# Patient Record
Sex: Female | Born: 1950
Health system: Southern US, Community
[De-identification: ages and names within clinical notes are randomized; demographics above are authoritative.]

## PROBLEM LIST (undated history)

## (undated) DIAGNOSIS — N840 Polyp of corpus uteri: Secondary | ICD-10-CM

## (undated) DIAGNOSIS — N938 Other specified abnormal uterine and vaginal bleeding: Secondary | ICD-10-CM

## (undated) DIAGNOSIS — M1990 Unspecified osteoarthritis, unspecified site: Secondary | ICD-10-CM

## (undated) HISTORY — PX: HYSTEROSCOPY: SHX211

## (undated) HISTORY — PX: OTHER SURGICAL HISTORY: SHX169

## (undated) HISTORY — DX: Unspecified osteoarthritis, unspecified site: M19.90

## (undated) HISTORY — PX: TUBAL LIGATION: SHX77

## (undated) HISTORY — DX: Polyp of corpus uteri: N84.0

## (undated) HISTORY — DX: Other specified abnormal uterine and vaginal bleeding: N93.8

---

## 1998-06-25 ENCOUNTER — Encounter: Payer: Self-pay | Admitting: Internal Medicine

## 1998-06-25 ENCOUNTER — Ambulatory Visit (HOSPITAL_COMMUNITY): Admission: RE | Admit: 1998-06-25 | Discharge: 1998-06-25 | Payer: Self-pay | Admitting: Internal Medicine

## 1999-01-20 ENCOUNTER — Other Ambulatory Visit: Admission: RE | Admit: 1999-01-20 | Discharge: 1999-01-20 | Payer: Self-pay | Admitting: Obstetrics and Gynecology

## 1999-01-20 ENCOUNTER — Encounter (INDEPENDENT_AMBULATORY_CARE_PROVIDER_SITE_OTHER): Payer: Self-pay

## 1999-03-25 ENCOUNTER — Ambulatory Visit (HOSPITAL_COMMUNITY): Admission: RE | Admit: 1999-03-25 | Discharge: 1999-03-25 | Payer: Self-pay | Admitting: Obstetrics and Gynecology

## 1999-03-25 ENCOUNTER — Encounter (INDEPENDENT_AMBULATORY_CARE_PROVIDER_SITE_OTHER): Payer: Self-pay | Admitting: Specialist

## 1999-06-30 ENCOUNTER — Encounter: Payer: Self-pay | Admitting: Obstetrics and Gynecology

## 1999-06-30 ENCOUNTER — Ambulatory Visit (HOSPITAL_COMMUNITY): Admission: RE | Admit: 1999-06-30 | Discharge: 1999-06-30 | Payer: Self-pay | Admitting: Obstetrics and Gynecology

## 1999-07-09 ENCOUNTER — Encounter (INDEPENDENT_AMBULATORY_CARE_PROVIDER_SITE_OTHER): Payer: Self-pay

## 1999-07-09 ENCOUNTER — Other Ambulatory Visit: Admission: RE | Admit: 1999-07-09 | Discharge: 1999-07-09 | Payer: Self-pay | Admitting: Obstetrics and Gynecology

## 2000-07-01 ENCOUNTER — Encounter: Payer: Self-pay | Admitting: Obstetrics and Gynecology

## 2000-07-01 ENCOUNTER — Ambulatory Visit (HOSPITAL_COMMUNITY): Admission: RE | Admit: 2000-07-01 | Discharge: 2000-07-01 | Payer: Self-pay | Admitting: Obstetrics and Gynecology

## 2001-07-17 ENCOUNTER — Ambulatory Visit (HOSPITAL_COMMUNITY): Admission: RE | Admit: 2001-07-17 | Discharge: 2001-07-17 | Payer: Self-pay | Admitting: Obstetrics and Gynecology

## 2001-07-17 ENCOUNTER — Encounter: Payer: Self-pay | Admitting: Obstetrics and Gynecology

## 2001-08-02 HISTORY — PX: BREAST BIOPSY: SHX20

## 2001-08-09 ENCOUNTER — Encounter: Admission: RE | Admit: 2001-08-09 | Discharge: 2001-08-09 | Payer: Self-pay | Admitting: Obstetrics and Gynecology

## 2001-08-09 ENCOUNTER — Encounter: Payer: Self-pay | Admitting: Obstetrics and Gynecology

## 2001-08-21 ENCOUNTER — Encounter: Admission: RE | Admit: 2001-08-21 | Discharge: 2001-08-21 | Payer: Self-pay | Admitting: Internal Medicine

## 2001-08-21 ENCOUNTER — Encounter (INDEPENDENT_AMBULATORY_CARE_PROVIDER_SITE_OTHER): Payer: Self-pay | Admitting: Specialist

## 2001-08-21 ENCOUNTER — Encounter: Payer: Self-pay | Admitting: Internal Medicine

## 2001-11-16 ENCOUNTER — Other Ambulatory Visit: Admission: RE | Admit: 2001-11-16 | Discharge: 2001-11-16 | Payer: Self-pay | Admitting: Obstetrics and Gynecology

## 2002-09-07 ENCOUNTER — Encounter: Admission: RE | Admit: 2002-09-07 | Discharge: 2002-09-07 | Payer: Self-pay | Admitting: Internal Medicine

## 2002-09-07 ENCOUNTER — Encounter: Payer: Self-pay | Admitting: Internal Medicine

## 2002-11-20 ENCOUNTER — Other Ambulatory Visit: Admission: RE | Admit: 2002-11-20 | Discharge: 2002-11-20 | Payer: Self-pay | Admitting: Obstetrics and Gynecology

## 2003-09-11 ENCOUNTER — Encounter: Admission: RE | Admit: 2003-09-11 | Discharge: 2003-09-11 | Payer: Self-pay | Admitting: Obstetrics and Gynecology

## 2003-12-04 ENCOUNTER — Other Ambulatory Visit: Admission: RE | Admit: 2003-12-04 | Discharge: 2003-12-04 | Payer: Self-pay | Admitting: Obstetrics and Gynecology

## 2004-09-21 ENCOUNTER — Encounter: Admission: RE | Admit: 2004-09-21 | Discharge: 2004-09-21 | Payer: Self-pay | Admitting: Obstetrics and Gynecology

## 2004-12-07 ENCOUNTER — Other Ambulatory Visit: Admission: RE | Admit: 2004-12-07 | Discharge: 2004-12-07 | Payer: Self-pay | Admitting: Obstetrics and Gynecology

## 2005-09-30 ENCOUNTER — Encounter: Admission: RE | Admit: 2005-09-30 | Discharge: 2005-09-30 | Payer: Self-pay | Admitting: Obstetrics and Gynecology

## 2005-12-10 ENCOUNTER — Other Ambulatory Visit: Admission: RE | Admit: 2005-12-10 | Discharge: 2005-12-10 | Payer: Self-pay | Admitting: Obstetrics and Gynecology

## 2006-10-03 ENCOUNTER — Encounter: Admission: RE | Admit: 2006-10-03 | Discharge: 2006-10-03 | Payer: Self-pay | Admitting: Obstetrics and Gynecology

## 2006-11-01 ENCOUNTER — Encounter: Admission: RE | Admit: 2006-11-01 | Discharge: 2006-11-01 | Payer: Self-pay | Admitting: Obstetrics and Gynecology

## 2006-12-12 ENCOUNTER — Other Ambulatory Visit: Admission: RE | Admit: 2006-12-12 | Discharge: 2006-12-12 | Payer: Self-pay | Admitting: Obstetrics and Gynecology

## 2007-11-10 ENCOUNTER — Encounter: Admission: RE | Admit: 2007-11-10 | Discharge: 2007-11-10 | Payer: Self-pay | Admitting: Obstetrics and Gynecology

## 2007-12-14 ENCOUNTER — Other Ambulatory Visit: Admission: RE | Admit: 2007-12-14 | Discharge: 2007-12-14 | Payer: Self-pay | Admitting: Obstetrics and Gynecology

## 2008-08-19 ENCOUNTER — Ambulatory Visit: Payer: Self-pay | Admitting: Obstetrics and Gynecology

## 2008-11-11 ENCOUNTER — Encounter: Admission: RE | Admit: 2008-11-11 | Discharge: 2008-11-11 | Payer: Self-pay | Admitting: Obstetrics and Gynecology

## 2008-11-19 ENCOUNTER — Encounter: Admission: RE | Admit: 2008-11-19 | Discharge: 2008-11-19 | Payer: Self-pay | Admitting: Obstetrics and Gynecology

## 2008-12-16 ENCOUNTER — Encounter: Payer: Self-pay | Admitting: Obstetrics and Gynecology

## 2008-12-16 ENCOUNTER — Ambulatory Visit: Payer: Self-pay | Admitting: Obstetrics and Gynecology

## 2008-12-16 ENCOUNTER — Other Ambulatory Visit: Admission: RE | Admit: 2008-12-16 | Discharge: 2008-12-16 | Payer: Self-pay | Admitting: Obstetrics and Gynecology

## 2009-11-14 ENCOUNTER — Encounter: Admission: RE | Admit: 2009-11-14 | Discharge: 2009-11-14 | Payer: Self-pay | Admitting: Obstetrics and Gynecology

## 2009-12-24 ENCOUNTER — Other Ambulatory Visit: Admission: RE | Admit: 2009-12-24 | Discharge: 2009-12-24 | Payer: Self-pay | Admitting: Obstetrics and Gynecology

## 2009-12-24 ENCOUNTER — Ambulatory Visit: Payer: Self-pay | Admitting: Obstetrics and Gynecology

## 2010-10-14 ENCOUNTER — Encounter (INDEPENDENT_AMBULATORY_CARE_PROVIDER_SITE_OTHER): Payer: 59

## 2010-10-14 DIAGNOSIS — Z13820 Encounter for screening for osteoporosis: Secondary | ICD-10-CM

## 2010-10-19 ENCOUNTER — Other Ambulatory Visit: Payer: Self-pay | Admitting: Obstetrics and Gynecology

## 2010-10-19 DIAGNOSIS — Z1231 Encounter for screening mammogram for malignant neoplasm of breast: Secondary | ICD-10-CM

## 2010-10-30 ENCOUNTER — Ambulatory Visit
Admission: RE | Admit: 2010-10-30 | Discharge: 2010-10-30 | Disposition: A | Discharge status: Home / Self Care | Payer: 59 | Source: Ambulatory Visit | Attending: Family Medicine | Admitting: Family Medicine

## 2010-10-30 ENCOUNTER — Other Ambulatory Visit: Payer: Self-pay | Admitting: Family Medicine

## 2010-10-30 DIAGNOSIS — M545 Low back pain, unspecified: Secondary | ICD-10-CM

## 2010-11-16 ENCOUNTER — Ambulatory Visit: Payer: 59

## 2010-11-16 ENCOUNTER — Ambulatory Visit
Admission: RE | Admit: 2010-11-16 | Discharge: 2010-11-16 | Disposition: A | Discharge status: Home / Self Care | Payer: 59 | Source: Ambulatory Visit | Attending: Obstetrics and Gynecology | Admitting: Obstetrics and Gynecology

## 2010-11-16 DIAGNOSIS — Z1231 Encounter for screening mammogram for malignant neoplasm of breast: Secondary | ICD-10-CM

## 2010-12-30 ENCOUNTER — Encounter (INDEPENDENT_AMBULATORY_CARE_PROVIDER_SITE_OTHER): Payer: 59 | Admitting: Obstetrics and Gynecology

## 2010-12-30 ENCOUNTER — Other Ambulatory Visit (HOSPITAL_COMMUNITY)
Admission: RE | Admit: 2010-12-30 | Discharge: 2010-12-30 | Disposition: A | Discharge status: Home / Self Care | Payer: 59 | Source: Ambulatory Visit | Attending: Obstetrics and Gynecology | Admitting: Obstetrics and Gynecology

## 2010-12-30 ENCOUNTER — Other Ambulatory Visit: Payer: Self-pay | Admitting: Obstetrics and Gynecology

## 2010-12-30 DIAGNOSIS — Z13220 Encounter for screening for lipoid disorders: Secondary | ICD-10-CM

## 2010-12-30 DIAGNOSIS — Z124 Encounter for screening for malignant neoplasm of cervix: Secondary | ICD-10-CM | POA: Insufficient documentation

## 2010-12-30 DIAGNOSIS — R823 Hemoglobinuria: Secondary | ICD-10-CM

## 2010-12-30 DIAGNOSIS — Z833 Family history of diabetes mellitus: Secondary | ICD-10-CM

## 2010-12-30 DIAGNOSIS — Z01419 Encounter for gynecological examination (general) (routine) without abnormal findings: Secondary | ICD-10-CM

## 2011-10-15 ENCOUNTER — Other Ambulatory Visit: Payer: Self-pay | Admitting: Obstetrics and Gynecology

## 2011-10-15 DIAGNOSIS — Z1231 Encounter for screening mammogram for malignant neoplasm of breast: Secondary | ICD-10-CM

## 2011-11-19 ENCOUNTER — Ambulatory Visit
Admission: RE | Admit: 2011-11-19 | Discharge: 2011-11-19 | Disposition: A | Discharge status: Home / Self Care | Payer: 59 | Source: Ambulatory Visit | Attending: Obstetrics and Gynecology | Admitting: Obstetrics and Gynecology

## 2011-11-19 DIAGNOSIS — Z1231 Encounter for screening mammogram for malignant neoplasm of breast: Secondary | ICD-10-CM

## 2011-12-23 ENCOUNTER — Encounter: Payer: Self-pay | Admitting: Gynecology

## 2011-12-23 DIAGNOSIS — N938 Other specified abnormal uterine and vaginal bleeding: Secondary | ICD-10-CM | POA: Insufficient documentation

## 2011-12-23 DIAGNOSIS — N840 Polyp of corpus uteri: Secondary | ICD-10-CM | POA: Insufficient documentation

## 2011-12-31 ENCOUNTER — Ambulatory Visit (INDEPENDENT_AMBULATORY_CARE_PROVIDER_SITE_OTHER): Payer: 59 | Admitting: Obstetrics and Gynecology

## 2011-12-31 ENCOUNTER — Encounter: Payer: Self-pay | Admitting: Obstetrics and Gynecology

## 2011-12-31 VITALS — BP 124/70 | Ht 63.0 in | Wt 166.0 lb

## 2011-12-31 DIAGNOSIS — M1990 Unspecified osteoarthritis, unspecified site: Secondary | ICD-10-CM | POA: Insufficient documentation

## 2011-12-31 DIAGNOSIS — Z01419 Encounter for gynecological examination (general) (routine) without abnormal findings: Secondary | ICD-10-CM

## 2011-12-31 NOTE — Progress Notes (Signed)
Patient came to see me today for her annual GYN exam. She continues to struggle with hot flashes but still is not ready to initiate HRT. She is having no vaginal bleeding. She is having no pelvic pain she has had a colonoscopy. She has had 4 normal bone densities. She does her lab work from her PCP. She's been diagnosed with osteoarthritis. She is having no bladder symptoms such as dysuria, frequency, urgency, or incontinence. She has never had a abnormal Pap smear.  Physical examination:Mary Hill present. HEENT within normal limits. Neck: Thyroid not large. No masses. Supraclavicular nodes: not enlarged. Breasts: Examined in both sitting and lying  position. No skin changes and no masses. Abdomen: Soft no guarding rebound or masses or hernia. Pelvic: External: Within normal limits. BUS: Within normal limits. Vaginal:within normal limits. Good estrogen effect. No evidence of cystocele rectocele or enterocele. Cervix: clean. Uterus: Normal size and shape. Adnexa: No masses. Rectovaginal exam: Confirmatory and negative. Extremities: Within normal limits.  Assessment: Menopausal symptoms  Plan: Continue yearly mammograms. She will call when necessary need for HRT.

## 2012-01-01 LAB — URINALYSIS W MICROSCOPIC + REFLEX CULTURE
Hgb urine dipstick: NEGATIVE
Nitrite: NEGATIVE
Protein, ur: NEGATIVE mg/dL
Urobilinogen, UA: 0.2 mg/dL (ref 0.0–1.0)

## 2012-01-02 LAB — URINE CULTURE: Colony Count: 75000

## 2012-10-17 ENCOUNTER — Other Ambulatory Visit: Payer: Self-pay

## 2012-10-17 DIAGNOSIS — Z1231 Encounter for screening mammogram for malignant neoplasm of breast: Secondary | ICD-10-CM

## 2012-11-24 ENCOUNTER — Ambulatory Visit
Admission: RE | Admit: 2012-11-24 | Discharge: 2012-11-24 | Disposition: A | Discharge status: Home / Self Care | Payer: 59 | Source: Ambulatory Visit

## 2012-11-24 DIAGNOSIS — Z1231 Encounter for screening mammogram for malignant neoplasm of breast: Secondary | ICD-10-CM

## 2013-01-05 ENCOUNTER — Ambulatory Visit (INDEPENDENT_AMBULATORY_CARE_PROVIDER_SITE_OTHER): Payer: 59 | Admitting: Women's Health

## 2013-01-05 ENCOUNTER — Encounter: Payer: Self-pay | Admitting: Women's Health

## 2013-01-05 ENCOUNTER — Other Ambulatory Visit (HOSPITAL_COMMUNITY)
Admission: RE | Admit: 2013-01-05 | Discharge: 2013-01-05 | Disposition: A | Discharge status: Home / Self Care | Payer: 59 | Source: Ambulatory Visit | Attending: Gynecology | Admitting: Gynecology

## 2013-01-05 VITALS — BP 112/70 | Ht 62.5 in | Wt 160.0 lb

## 2013-01-05 DIAGNOSIS — Z833 Family history of diabetes mellitus: Secondary | ICD-10-CM

## 2013-01-05 DIAGNOSIS — Z01419 Encounter for gynecological examination (general) (routine) without abnormal findings: Secondary | ICD-10-CM | POA: Insufficient documentation

## 2013-01-05 DIAGNOSIS — Z13220 Encounter for screening for lipoid disorders: Secondary | ICD-10-CM

## 2013-01-05 DIAGNOSIS — E079 Disorder of thyroid, unspecified: Secondary | ICD-10-CM

## 2013-01-05 LAB — CBC WITH DIFFERENTIAL/PLATELET
Basophils Absolute: 0 10*3/uL (ref 0.0–0.1)
Eosinophils Relative: 4 % (ref 0–5)
Lymphocytes Relative: 46 % (ref 12–46)
Lymphs Abs: 3.1 10*3/uL (ref 0.7–4.0)
MCV: 81.3 fL (ref 78.0–100.0)
Neutrophils Relative %: 41 % — ABNORMAL LOW (ref 43–77)
Platelets: 201 10*3/uL (ref 150–400)
RBC: 4.33 MIL/uL (ref 3.87–5.11)
RDW: 14.7 % (ref 11.5–15.5)
WBC: 6.7 10*3/uL (ref 4.0–10.5)

## 2013-01-05 LAB — GLUCOSE, RANDOM: Glucose, Bld: 101 mg/dL — ABNORMAL HIGH (ref 70–99)

## 2013-01-05 LAB — LIPID PANEL
LDL Cholesterol: 42 mg/dL (ref 0–99)
Triglycerides: 134 mg/dL (ref <150)

## 2013-01-05 NOTE — Addendum Note (Signed)
Addended by: Bertram Savin A on: 01/05/2013 04:18 PM   Modules accepted: Orders

## 2013-01-05 NOTE — Progress Notes (Signed)
Mary Hill 07/11/1951 [id number]    History:    The patient presents for annual exam.  Postmenopausal on no HRT. Mother died of breast cancer at age 62. Normal Pap and mammogram history. Negative colonoscopy 2009. Normal DEXA 10/2010 T score -0.6  left femoral neck.   Past medical history, past surgical history, family history and social history were all reviewed and documented in the EPIC chart. Works at [employer]. Has 2 grown sons both doing well. Benign endometrial polyp 2000. Father hypertension. Brother diabetes.   ROS:  A  ROS was performed and pertinent positives and negatives are included in the history.  Exam:  Filed Vitals:   01/05/13 1527  BP: 112/70    General appearance:  Normal Head/Neck:  Normal, without cervical or supraclavicular adenopathy. Thyroid:  Symmetrical, normal in size, without palpable masses or nodularity. Respiratory  Effort:  Normal  Auscultation:  Clear without wheezing or rhonchi Cardiovascular  Auscultation:  Regular rate, without rubs, murmurs or gallops  Edema/varicosities:  Not grossly evident Abdominal  Soft,nontender, without masses, guarding or rebound.  Liver/spleen:  No organomegaly noted  Hernia:  None appreciated  Skin  Inspection:  Grossly normal  Palpation:  Grossly normal Neurologic/psychiatric  Orientation:  Normal with appropriate conversation.  Mood/affect:  Normal  Genitourinary    Breasts: Examined lying and sitting.     Right: Without masses, retractions, discharge or axillary adenopathy.     Left: Without masses, retractions, discharge or axillary adenopathy.   Inguinal/mons:  Normal without inguinal adenopathy  External genitalia:  Normal  BUS/Urethra/Skene's glands:  Normal  Bladder:  Normal  Vagina:  Normal  Cervix:  Normal  Uterus:   normal in size, shape and contour.  Midline and mobile  Adnexa/parametria:     Rt: Without masses or tenderness.   Lt: Without masses or tenderness.  Anus and  perineum: Normal  Digital rectal exam: Normal sphincter tone without palpated masses or tenderness  Assessment/Plan:  62 y.o. MBF G2 P2 for annual exam with no complaints.  Normal postmenopausal exam on no HRT  Plan: Repeat DEXA next year, home safety and fall prevention discussed. Increase regular exercise, calcium rich diet, vitamin D 2000 daily encouraged. SBE's, continue annual mammogram. CBC, glucose, lipid panel, TSH, UA, Pap. Pap normal 2012, new screening guidelines reviewed. Home Hemoccult card given with instructions.   Harrington Challenger [facility], 3:59 PM 01/05/2013

## 2013-01-05 NOTE — Patient Instructions (Addendum)
zostavac or shingles vaccine Health Recommendations for Postmenopausal Women Respected and ongoing research has looked at the most common causes of death, disability, and poor quality of life in postmenopausal women. The causes include heart disease, diseases of blood vessels, diabetes, depression, cancer, and bone loss (osteoporosis). Many things can be done to help lower the chances of developing these and other common problems: CARDIOVASCULAR DISEASE Heart Disease: A heart attack is a medical emergency. Know the signs and symptoms of a heart attack. Below are things women can do to reduce their risk for heart disease.   Do not smoke. If you smoke, quit.  Aim for a healthy weight. Being overweight causes many preventable deaths. Eat a healthy and balanced diet and drink an adequate amount of liquids.  Get moving. Make a commitment to be more physically active. Aim for 30 minutes of activity on most, if not all days of the week.  Eat for heart health. Choose a diet that is low in saturated fat and cholesterol and eliminate trans fat. Include whole grains, vegetables, and fruits. Read and understand the labels on food containers before buying.  Know your numbers. Ask your caregiver to check your blood pressure, cholesterol (total, HDL, LDL, triglycerides) and blood glucose. Work with your caregiver on improving your entire clinical picture.  High blood pressure. Limit or stop your table salt intake (try salt substitute and food seasonings). Avoid salty foods and drinks. Read labels on food containers before buying. Eating well and exercising can help control high blood pressure. STROKE  Stroke is a medical emergency. Stroke may be the result of a blood clot in a blood vessel in the brain or by a brain hemorrhage (bleeding). Know the signs and symptoms of a stroke. To lower the risk of developing a stroke:  Avoid fatty foods.  Quit smoking.  Control your diabetes, blood pressure, and irregular  heart rate. THROMBOPHLEBITIS (BLOOD CLOT) OF THE LEG  Becoming overweight and leading a stationary lifestyle may also contribute to developing blood clots. Controlling your diet and exercising will help lower the risk of developing blood clots. CANCER SCREENING  Breast Cancer: Take steps to reduce your risk of breast cancer.  You should practice "breast self-awareness." This means understanding the normal appearance and feel of your breasts and should include breast self-examination. Any changes detected, no matter how small, should be reported to your caregiver.  After age 35, you should have a clinical breast exam (CBE) every year.  Starting at age 52, you should consider having a mammogram (breast X-ray) every year.  If you have a family history of breast cancer, talk to your caregiver about genetic screening.  If you are at high risk for breast cancer, talk to your caregiver about having an MRI and a mammogram every year.  Intestinal or Stomach Cancer: Tests to consider are a rectal exam, fecal occult blood, sigmoidoscopy, and colonoscopy. Women who are high risk may need to be screened at an earlier age and more often.  Cervical Cancer:  Beginning at age 47, you should have a Pap test every 3 years as long as the past 3 Pap tests have been normal.  If you have had past treatment for cervical cancer or a condition that could lead to cancer, you need Pap tests and screening for cancer for at least 20 years after your treatment.  If you had a hysterectomy for a problem that was not cancer or a condition that could lead to cancer, then you no longer  need Pap tests.  If you are between ages 43 and 31, and you have had normal Pap tests going back 10 years, you no longer need Pap tests.  If Pap tests have been discontinued, risk factors (such as a new sexual partner) need to be reassessed to determine if screening should be resumed.  Some medical problems can increase the chance of  getting cervical cancer. In these cases, your caregiver may recommend more frequent screening and Pap tests.  Uterine Cancer: If you have vaginal bleeding after reaching menopause, you should notify your caregiver.  Ovarian cancer: Other than yearly pelvic exams, there are no reliable tests available to screen for ovarian cancer at this time except for yearly pelvic exams.  Lung Cancer: Yearly chest X-rays can detect lung cancer and should be done on high risk women, such as cigarette smokers and women with chronic lung disease (emphysema).  Skin Cancer: A complete body skin exam should be done at your yearly examination. Avoid overexposure to the sun and ultraviolet light lamps. Use a strong sun block cream when in the sun. All of these things are important in lowering the risk of skin cancer. MENOPAUSE Menopause Symptoms: Hormone therapy products are effective for treating symptoms associated with menopause:  Moderate to severe hot flashes.  Night sweats.  Mood swings.  Headaches.  Tiredness.  Loss of sex drive.  Insomnia.  Other symptoms. Hormone replacement carries certain risks, especially in older women. Women who use or are thinking about using estrogen or estrogen with progestin treatments should discuss that with their caregiver. Your caregiver will help you understand the benefits and risks. The ideal dose of hormone replacement therapy is not known. The Food and Drug Administration (FDA) has concluded that hormone therapy should be used only at the lowest doses and for the shortest amount of time to reach treatment goals.  OSTEOPOROSIS Protecting Against Bone Loss and Preventing Fracture: If you use hormone therapy for prevention of bone loss (osteoporosis), the risks for bone loss must outweigh the risk of the therapy. Ask your caregiver about other medications known to be safe and effective for preventing bone loss and fractures. To guard against bone loss or fractures, the  following is recommended:  If you are less than age 26, take 1000 mg of calcium and at least 600 mg of Vitamin D per day.  If you are greater than age 68 but less than age 5, take 1200 mg of calcium and at least 600 mg of Vitamin D per day.  If you are greater than age 28, take 1200 mg of calcium and at least 800 mg of Vitamin D per day. Smoking and excessive alcohol intake increases the risk of osteoporosis. Eat foods rich in calcium and vitamin D and do weight bearing exercises several times a week as your caregiver suggests. DIABETES Diabetes Melitus: If you have Type I or Type 2 diabetes, you should keep your blood sugar under control with diet, exercise and recommended medication. Avoid too many sweets, starchy and fatty foods. Being overweight can make control more difficult. COGNITION AND MEMORY Cognition and Memory: Menopausal hormone therapy is not recommended for the prevention of cognitive disorders such as Alzheimer's disease or memory loss.  DEPRESSION  Depression may occur at any age, but is common in elderly women. The reasons may be because of physical, medical, social (loneliness), or financial problems and needs. If you are experiencing depression because of medical problems and control of symptoms, talk to your caregiver about  this. Physical activity and exercise may help with mood and sleep. Community and volunteer involvement may help your sense of value and worth. If you have depression and you feel that the problem is getting worse or becoming severe, talk to your caregiver about treatment options that are best for you. ACCIDENTS  Accidents are common and can be serious in the elderly woman. Prepare your house to prevent accidents. Eliminate throw rugs, place hand bars in the bath, shower and toilet areas. Avoid wearing high heeled shoes or walking on wet, snowy, and icy areas. Limit or stop driving if you have vision or hearing problems, or you feel you are unsteady with you  movements and reflexes. HEPATITIS C Hepatitis C is a type of viral infection affecting the liver. It is spread mainly through contact with blood from an infected person. It can be treated, but if left untreated, it can lead to severe liver damage over years. Many people who are infected do not know that the virus is in their blood. If you are a "baby-boomer", it is recommended that you have one screening test for Hepatitis C. IMMUNIZATIONS  Several immunizations are important to consider having during your senior years, including:   Tetanus, diptheria, and pertussis booster shot.  Influenza every year before the flu season begins.  Pneumonia vaccine.  Shingles vaccine.  Others as indicated based on your specific needs. Talk to your caregiver about these. Document Released: 09/10/2005 Document Revised: 07/05/2012 Document Reviewed: 05/06/2008 [facility] Patient Information 2014 [place], [place].

## 2013-01-06 LAB — URINALYSIS W MICROSCOPIC + REFLEX CULTURE
Casts: NONE SEEN
Crystals: NONE SEEN
Glucose, UA: NEGATIVE mg/dL
Ketones, ur: NEGATIVE mg/dL
Nitrite: NEGATIVE
Specific Gravity, Urine: 1.02 (ref 1.005–1.030)
pH: 6 (ref 5.0–8.0)

## 2013-01-07 LAB — URINE CULTURE
Colony Count: NO GROWTH
Organism ID, Bacteria: NO GROWTH

## 2013-01-08 ENCOUNTER — Encounter: Payer: Self-pay | Admitting: Obstetrics and Gynecology

## 2013-01-15 ENCOUNTER — Encounter: Payer: Self-pay | Admitting: Obstetrics and Gynecology

## 2013-10-29 ENCOUNTER — Other Ambulatory Visit: Payer: Self-pay

## 2013-10-29 DIAGNOSIS — Z803 Family history of malignant neoplasm of breast: Secondary | ICD-10-CM

## 2013-10-29 DIAGNOSIS — Z1231 Encounter for screening mammogram for malignant neoplasm of breast: Secondary | ICD-10-CM

## 2013-11-30 ENCOUNTER — Ambulatory Visit
Admission: RE | Admit: 2013-11-30 | Discharge: 2013-11-30 | Disposition: A | Discharge status: Home / Self Care | Payer: 59 | Source: Ambulatory Visit

## 2013-11-30 ENCOUNTER — Encounter (INDEPENDENT_AMBULATORY_CARE_PROVIDER_SITE_OTHER): Payer: Self-pay

## 2013-11-30 DIAGNOSIS — Z803 Family history of malignant neoplasm of breast: Secondary | ICD-10-CM

## 2013-11-30 DIAGNOSIS — Z1231 Encounter for screening mammogram for malignant neoplasm of breast: Secondary | ICD-10-CM

## 2014-01-11 ENCOUNTER — Ambulatory Visit (INDEPENDENT_AMBULATORY_CARE_PROVIDER_SITE_OTHER): Payer: 59 | Admitting: Women's Health

## 2014-01-11 ENCOUNTER — Encounter: Payer: Self-pay | Admitting: Women's Health

## 2014-01-11 VITALS — BP 118/64 | Ht 62.75 in | Wt 163.8 lb

## 2014-01-11 DIAGNOSIS — N840 Polyp of corpus uteri: Secondary | ICD-10-CM

## 2014-01-11 DIAGNOSIS — Z01419 Encounter for gynecological examination (general) (routine) without abnormal findings: Secondary | ICD-10-CM

## 2014-01-11 NOTE — Progress Notes (Signed)
Mary Hill 11-07-1950 [id number]  History:    Presents for annual exam. Mary Hailostmenopausal/No bleeding/no HRT. History of benign endometrial polyp in 2000. Normal pap and mammogram history. 2012 DEXA Tscore -0.6 femoral neck. Colonoscopy 2009 negative.   Past medical history, past surgical history, family history and social history were all reviewed and documented in the EPIC chart. Mother breast cancer, died age 63, father hypertension, brother diabetes. Works at [employer]. 2 grown sons doing well.   ROS:  A  12 point ROS was performed and pertinent positives and negatives are included.  Exam:  Filed Vitals:   01/11/14 1459  BP: 118/64    General appearance:  Normal Thyroid:  Symmetrical, normal in size, without palpable masses or nodularity. Respiratory  Auscultation:  Mild expiratory wheeze bilaterally. No rhonchi or rales.  Cardiovascular  Auscultation:  Regular rate, without rubs, murmurs or gallops  Edema/varicosities:  Not grossly evident Abdominal  Soft,nontender, without masses, guarding or rebound.  Liver/spleen:  No organomegaly noted  Hernia:  None appreciated  Skin  Inspection:  Grossly normal   Breasts: Examined lying and sitting.     Right: Without masses, retractions, discharge or axillary adenopathy.    Left: Without masses, retractions, discharge or axillary adenopathy. Gentitourinary   Inguinal/mons:  Normal without inguinal adenopathy  External genitalia:  Normal  BUS/Urethra/Skene's glands:  Normal  Vagina:  Normal  Cervix:  Normal  Uterus:  retroverted, normal in size, shape and contour.  Midline and mobile  Adnexa/parametria:     Rt: Without masses or tenderness.   Lt: Without masses or tenderness.  Anus and perineum: Normal  Digital rectal exam: Normal sphincter tone without palpated masses or tenderness  Assessment/Plan:  63 y.o.  G3P2 MBF for annual exam with no complaints.  Postmenopausal / no HRT/no bleeding Seasonal allergies  Plan: Pap  normal 2014, new screening guidelines reviewed.  Reduce calories for weight loss, exercise, vit D 2000 recommended. SBE's, 3D mammograms encouraged. Lipid panel, CBC, CMP, UA. Zostavax prescription given. Repeat DEXA in 1 year.   Note: This dictation was prepared with Dragon/digital dictation.  Any transcriptional errors that result are unintentional. Harrington ChallengerYOUNG,Mary Hill [facility] LPWHNP, 3:30 PM 01/11/2014

## 2014-01-11 NOTE — Patient Instructions (Signed)
Health Recommendations for Postmenopausal Women Respected and ongoing research has looked at the most common causes of death, disability, and poor quality of life in postmenopausal women. The causes include heart disease, diseases of blood vessels, diabetes, depression, cancer, and bone loss (osteoporosis). Many things can be done to help lower the chances of developing these and other common problems: CARDIOVASCULAR DISEASE Heart Disease: A heart attack is a medical emergency. Know the signs and symptoms of a heart attack. Below are things women can do to reduce their risk for heart disease.   Do not smoke. If you smoke, quit.  Aim for a healthy weight. Being overweight causes many preventable deaths. Eat a healthy and balanced diet and drink an adequate amount of liquids.  Get moving. Make a commitment to be more physically active. Aim for 30 minutes of activity on most, if not all days of the week.  Eat for heart health. Choose a diet that is low in saturated fat and cholesterol and eliminate trans fat. Include whole grains, vegetables, and fruits. Read and understand the labels on food containers before buying.  Know your numbers. Ask your caregiver to check your blood pressure, cholesterol (total, HDL, LDL, triglycerides) and blood glucose. Work with your caregiver on improving your entire clinical picture.  High blood pressure. Limit or stop your table salt intake (try salt substitute and food seasonings). Avoid salty foods and drinks. Read labels on food containers before buying. Eating well and exercising can help control high blood pressure. STROKE  Stroke is a medical emergency. Stroke may be the result of a blood clot in a blood vessel in the brain or by a brain hemorrhage (bleeding). Know the signs and symptoms of a stroke. To lower the risk of developing a stroke:  Avoid fatty foods.  Quit smoking.  Control your diabetes, blood pressure, and irregular heart rate. THROMBOPHLEBITIS  (BLOOD CLOT) OF THE LEG  Becoming overweight and leading a stationary lifestyle may also contribute to developing blood clots. Controlling your diet and exercising will help lower the risk of developing blood clots. CANCER SCREENING  Breast Cancer: Take steps to reduce your risk of breast cancer.  You should practice "breast self-awareness." This means understanding the normal appearance and feel of your breasts and should include breast self-examination. Any changes detected, no matter how small, should be reported to your caregiver.  After age 40, you should have a clinical breast exam (CBE) every year.  Starting at age 40, you should consider having a mammogram (breast X-ray) every year.  If you have a family history of breast cancer, talk to your caregiver about genetic screening.  If you are at high risk for breast cancer, talk to your caregiver about having an MRI and a mammogram every year.  Intestinal or Stomach Cancer: Tests to consider are a rectal exam, fecal occult blood, sigmoidoscopy, and colonoscopy. Women who are high risk may need to be screened at an earlier age and more often.  Cervical Cancer:  Beginning at age 30, you should have a Pap test every 3 years as long as the past 3 Pap tests have been normal.  If you have had past treatment for cervical cancer or a condition that could lead to cancer, you need Pap tests and screening for cancer for at least 20 years after your treatment.  If you had a hysterectomy for a problem that was not cancer or a condition that could lead to cancer, then you no longer need Pap tests.    If you are between ages 65 and 70, and you have had normal Pap tests going back 10 years, you no longer need Pap tests.  If Pap tests have been discontinued, risk factors (such as a new sexual partner) need to be reassessed to determine if screening should be resumed.  Some medical problems can increase the chance of getting cervical cancer. In these  cases, your caregiver may recommend more frequent screening and Pap tests.  Uterine Cancer: If you have vaginal bleeding after reaching menopause, you should notify your caregiver.  Ovarian cancer: Other than yearly pelvic exams, there are no reliable tests available to screen for ovarian cancer at this time except for yearly pelvic exams.  Lung Cancer: Yearly chest X-rays can detect lung cancer and should be done on high risk women, such as cigarette smokers and women with chronic lung disease (emphysema).  Skin Cancer: A complete body skin exam should be done at your yearly examination. Avoid overexposure to the sun and ultraviolet light lamps. Use a strong sun block cream when in the sun. All of these things are important in lowering the risk of skin cancer. MENOPAUSE Menopause Symptoms: Hormone therapy products are effective for treating symptoms associated with menopause:  Moderate to severe hot flashes.  Night sweats.  Mood swings.  Headaches.  Tiredness.  Loss of sex drive.  Insomnia.  Other symptoms. Hormone replacement carries certain risks, especially in older women. Women who use or are thinking about using estrogen or estrogen with progestin treatments should discuss that with their caregiver. Your caregiver will help you understand the benefits and risks. The ideal dose of hormone replacement therapy is not known. The Food and Drug Administration (FDA) has concluded that hormone therapy should be used only at the lowest doses and for the shortest amount of time to reach treatment goals.  OSTEOPOROSIS Protecting Against Bone Loss and Preventing Fracture: If you use hormone therapy for prevention of bone loss (osteoporosis), the risks for bone loss must outweigh the risk of the therapy. Ask your caregiver about other medications known to be safe and effective for preventing bone loss and fractures. To guard against bone loss or fractures, the following is recommended:  If  you are less than age 50, take 1000 mg of calcium and at least 600 mg of Vitamin D per day.  If you are greater than age 50 but less than age 70, take 1200 mg of calcium and at least 600 mg of Vitamin D per day.  If you are greater than age 70, take 1200 mg of calcium and at least 800 mg of Vitamin D per day. Smoking and excessive alcohol intake increases the risk of osteoporosis. Eat foods rich in calcium and vitamin D and do weight bearing exercises several times a week as your caregiver suggests. DIABETES Diabetes Melitus: If you have Type I or Type 2 diabetes, you should keep your blood sugar under control with diet, exercise and recommended medication. Avoid too many sweets, starchy and fatty foods. Being overweight can make control more difficult. COGNITION AND MEMORY Cognition and Memory: Menopausal hormone therapy is not recommended for the prevention of cognitive disorders such as Alzheimer's disease or memory loss.  DEPRESSION  Depression may occur at any age, but is common in elderly women. The reasons may be because of physical, medical, social (loneliness), or financial problems and needs. If you are experiencing depression because of medical problems and control of symptoms, talk to your caregiver about this. Physical activity and   exercise may help with mood and sleep. Community and volunteer involvement may help your sense of value and worth. If you have depression and you feel that the problem is getting worse or becoming severe, talk to your caregiver about treatment options that are best for you. ACCIDENTS  Accidents are common and can be serious in the elderly woman. Prepare your house to prevent accidents. Eliminate throw rugs, place hand bars in the bath, shower and toilet areas. Avoid wearing high heeled shoes or walking on wet, snowy, and icy areas. Limit or stop driving if you have vision or hearing problems, or you feel you are unsteady with you movements and  reflexes. HEPATITIS C Hepatitis C is a type of viral infection affecting the liver. It is spread mainly through contact with blood from an infected person. It can be treated, but if left untreated, it can lead to severe liver damage over years. Many people who are infected do not know that the virus is in their blood. If you are a "baby-boomer", it is recommended that you have one screening test for Hepatitis C. IMMUNIZATIONS  Several immunizations are important to consider having during your senior years, including:   Tetanus, diptheria, and pertussis booster shot.  Influenza every year before the flu season begins.  Pneumonia vaccine.  Shingles vaccine.  Others as indicated based on your specific needs. Talk to your caregiver about these. Document Released: 09/10/2005 Document Revised: 07/05/2012 Document Reviewed: 05/06/2008 [employer] Patient Information 2014 [employer].  

## 2014-01-12 LAB — URINALYSIS W MICROSCOPIC + REFLEX CULTURE
Bilirubin Urine: NEGATIVE
Casts: NONE SEEN
Crystals: NONE SEEN
Glucose, UA: NEGATIVE mg/dL
HGB URINE DIPSTICK: NEGATIVE
Ketones, ur: NEGATIVE mg/dL
LEUKOCYTES UA: NEGATIVE
Nitrite: NEGATIVE
Protein, ur: NEGATIVE mg/dL
SQUAMOUS EPITHELIAL / LPF: NONE SEEN
Specific Gravity, Urine: 1.014 (ref 1.005–1.030)
UROBILINOGEN UA: 0.2 mg/dL (ref 0.0–1.0)
pH: 6 (ref 5.0–8.0)

## 2014-06-03 ENCOUNTER — Encounter: Payer: Self-pay | Admitting: Women's Health

## 2014-10-29 ENCOUNTER — Other Ambulatory Visit: Payer: Self-pay

## 2014-10-29 DIAGNOSIS — Z1231 Encounter for screening mammogram for malignant neoplasm of breast: Secondary | ICD-10-CM

## 2014-10-31 ENCOUNTER — Encounter: Payer: Self-pay | Admitting: Women's Health

## 2014-10-31 ENCOUNTER — Ambulatory Visit (INDEPENDENT_AMBULATORY_CARE_PROVIDER_SITE_OTHER): Payer: 59 | Admitting: Women's Health

## 2014-10-31 VITALS — BP 146/84 | Ht 62.0 in | Wt 166.0 lb

## 2014-10-31 DIAGNOSIS — B373 Candidiasis of vulva and vagina: Secondary | ICD-10-CM

## 2014-10-31 DIAGNOSIS — R350 Frequency of micturition: Secondary | ICD-10-CM

## 2014-10-31 DIAGNOSIS — B3731 Acute candidiasis of vulva and vagina: Secondary | ICD-10-CM

## 2014-10-31 LAB — URINALYSIS W MICROSCOPIC + REFLEX CULTURE
Bilirubin Urine: NEGATIVE
CRYSTALS: NONE SEEN
Casts: NONE SEEN
Glucose, UA: NEGATIVE mg/dL
Ketones, ur: NEGATIVE mg/dL
Nitrite: NEGATIVE
Protein, ur: NEGATIVE mg/dL
Specific Gravity, Urine: 1.015 (ref 1.005–1.030)
UROBILINOGEN UA: 0.2 mg/dL (ref 0.0–1.0)
pH: 5.5 (ref 5.0–8.0)

## 2014-10-31 LAB — WET PREP FOR TRICH, YEAST, CLUE
CLUE CELLS WET PREP: NONE SEEN
TRICH WET PREP: NONE SEEN

## 2014-10-31 MED ORDER — TERCONAZOLE 0.8 % VA CREA: 1.0000 | TOPICAL_CREAM | Freq: Every day | VAGINAL | Status: DC

## 2014-10-31 NOTE — Progress Notes (Signed)
Patient ID: Mary Hill, female   DOB: 1950/11/25, 64 y.o.   MRN: [id number] Presents with complaint of vaginal pain intermittent in nature for the past few weeks. Denies discharge, itching or odor. Urinary frequency without pain or burning. Denies abdominal pain or fever. No change in routine. Postmenopausal/no bleeding/no HRT  Exam: Appears well. External genitalia minimal erythema, speculum exam scant white discharge minimal erythema, wet prep positive for many yeast. Bimanual  - tenderness vaginally only. UA: Trace blood, trace leukocytes, 3-6 WBCs, few bacteria, 0-2 RBCs.  Yeast vaginitis  Plan: Terazol 3 one applicator at bedtime 3, prescription, proper use, call if no relief of discomfort. Urine culture pending. Yeast prevention discussed. Blood pressure elevated, instructed to check away from office if it continues greater than 130/80 follow-up with primary care.

## 2014-10-31 NOTE — Patient Instructions (Signed)

## 2014-11-02 LAB — URINE CULTURE

## 2014-12-10 ENCOUNTER — Ambulatory Visit
Admission: RE | Admit: 2014-12-10 | Discharge: 2014-12-10 | Disposition: A | Discharge status: Home / Self Care | Payer: 59 | Source: Ambulatory Visit

## 2014-12-10 ENCOUNTER — Encounter (INDEPENDENT_AMBULATORY_CARE_PROVIDER_SITE_OTHER): Payer: Self-pay

## 2014-12-10 DIAGNOSIS — Z1231 Encounter for screening mammogram for malignant neoplasm of breast: Secondary | ICD-10-CM

## 2015-01-15 ENCOUNTER — Encounter: Payer: Self-pay | Admitting: Women's Health

## 2015-01-15 ENCOUNTER — Ambulatory Visit (INDEPENDENT_AMBULATORY_CARE_PROVIDER_SITE_OTHER): Payer: 59 | Admitting: Women's Health

## 2015-01-15 VITALS — BP 116/72 | Ht 64.0 in | Wt 167.8 lb

## 2015-01-15 DIAGNOSIS — Z13820 Encounter for screening for osteoporosis: Secondary | ICD-10-CM | POA: Diagnosis not present

## 2015-01-15 DIAGNOSIS — Z01419 Encounter for gynecological examination (general) (routine) without abnormal findings: Secondary | ICD-10-CM

## 2015-01-15 NOTE — Patient Instructions (Signed)
Health Recommendations for Postmenopausal Women Respected and ongoing research has looked at the most common causes of death, disability, and poor quality of life in postmenopausal women. The causes include heart disease, diseases of blood vessels, diabetes, depression, cancer, and bone loss (osteoporosis). Many things can be done to help lower the chances of developing these and other common problems. CARDIOVASCULAR DISEASE Heart Disease: A heart attack is a medical emergency. Know the signs and symptoms of a heart attack. Below are things women can do to reduce their risk for heart disease.   Do not smoke. If you smoke, quit.  Aim for a healthy weight. Being overweight causes many preventable deaths. Eat a healthy and balanced diet and drink an adequate amount of liquids.  Get moving. Make a commitment to be more physically active. Aim for 30 minutes of activity on most, if not all days of the week.  Eat for heart health. Choose a diet that is low in saturated fat and cholesterol and eliminate trans fat. Include whole grains, vegetables, and fruits. Read and understand the labels on food containers before buying.  Know your numbers. Ask your caregiver to check your blood pressure, cholesterol (total, HDL, LDL, triglycerides) and blood glucose. Work with your caregiver on improving your entire clinical picture.  High blood pressure. Limit or stop your table salt intake (try salt substitute and food seasonings). Avoid salty foods and drinks. Read labels on food containers before buying. Eating well and exercising can help control high blood pressure. STROKE  Stroke is a medical emergency. Stroke may be the result of a blood clot in a blood vessel in the brain or by a brain hemorrhage (bleeding). Know the signs and symptoms of a stroke. To lower the risk of developing a stroke:  Avoid fatty foods.  Quit smoking.  Control your diabetes, blood pressure, and irregular heart rate. THROMBOPHLEBITIS  (BLOOD CLOT) OF THE LEG  Becoming overweight and leading a stationary lifestyle may also contribute to developing blood clots. Controlling your diet and exercising will help lower the risk of developing blood clots. CANCER SCREENING  Breast Cancer: Take steps to reduce your risk of breast cancer.  You should practice "breast self-awareness." This means understanding the normal appearance and feel of your breasts and should include breast self-examination. Any changes detected, no matter how small, should be reported to your caregiver.  After age 64, you should have a clinical breast exam (CBE) every year.  Starting at age 64, you should consider having a mammogram (breast X-ray) every year.  If you have a family history of breast cancer, talk to your caregiver about genetic screening.  If you are at high risk for breast cancer, talk to your caregiver about having an MRI and a mammogram every year.  Intestinal or Stomach Cancer: Tests to consider are a rectal exam, fecal occult blood, sigmoidoscopy, and colonoscopy. Women who are high risk may need to be screened at an earlier age and more often.  Cervical Cancer:  Beginning at age 64, you should have a Pap test every 3 years as long as the past 3 Pap tests have been normal.  If you have had past treatment for cervical cancer or a condition that could lead to cancer, you need Pap tests and screening for cancer for at least 20 years after your treatment.  If you had a hysterectomy for a problem that was not cancer or a condition that could lead to cancer, then you no longer need Pap tests.  If you are between ages 64 and 70, and you have had normal Pap tests going back 10 years, you no longer need Pap tests.  If Pap tests have been discontinued, risk factors (such as a new sexual partner) need to be reassessed to determine if screening should be resumed.  Some medical problems can increase the chance of getting cervical cancer. In these  cases, your caregiver may recommend more frequent screening and Pap tests.  Uterine Cancer: If you have vaginal bleeding after reaching menopause, you should notify your caregiver.  Ovarian Cancer: Other than yearly pelvic exams, there are no reliable tests available to screen for ovarian cancer at this time except for yearly pelvic exams.  Lung Cancer: Yearly chest X-rays can detect lung cancer and should be done on high risk women, such as cigarette smokers and women with chronic lung disease (emphysema).  Skin Cancer: A complete body skin exam should be done at your yearly examination. Avoid overexposure to the sun and ultraviolet light lamps. Use a strong sun block cream when in the sun. All of these things are important for lowering the risk of skin cancer. MENOPAUSE Menopause Symptoms: Hormone therapy products are effective for treating symptoms associated with menopause:  Moderate to severe hot flashes.  Night sweats.  Mood swings.  Headaches.  Tiredness.  Loss of sex drive.  Insomnia.  Other symptoms. Hormone replacement carries certain risks, especially in older women. Women who use or are thinking about using estrogen or estrogen with progestin treatments should discuss that with their caregiver. Your caregiver will help you understand the benefits and risks. The ideal dose of hormone replacement therapy is not known. The Food and Drug Administration (FDA) has concluded that hormone therapy should be used only at the lowest doses and for the shortest amount of time to reach treatment goals.  OSTEOPOROSIS Protecting Against Bone Loss and Preventing Fracture If you use hormone therapy for prevention of bone loss (osteoporosis), the risks for bone loss must outweigh the risk of the therapy. Ask your caregiver about other medications known to be safe and effective for preventing bone loss and fractures. To guard against bone loss or fractures, the following is recommended:  If  you are younger than age 50, take 1000 mg of calcium and at least 600 mg of Vitamin D per day.  If you are older than age 50 but younger than age 70, take 1200 mg of calcium and at least 600 mg of Vitamin D per day.  If you are older than age 70, take 1200 mg of calcium and at least 800 mg of Vitamin D per day. Smoking and excessive alcohol intake increases the risk of osteoporosis. Eat foods rich in calcium and vitamin D and do weight bearing exercises several times a week as your caregiver suggests. DIABETES Diabetes Mellitus: If you have type I or type 2 diabetes, you should keep your blood sugar under control with diet, exercise, and recommended medication. Avoid starchy and fatty foods, and too many sweets. Being overweight can make diabetes control more difficult. COGNITION AND MEMORY Cognition and Memory: Menopausal hormone therapy is not recommended for the prevention of cognitive disorders such as Alzheimer's disease or memory loss.  DEPRESSION  Depression may occur at any age, but it is common in elderly women. This may be because of physical, medical, social (loneliness), or financial problems and needs. If you are experiencing depression because of medical problems and control of symptoms, talk to your caregiver about this. Physical   activity and exercise may help with mood and sleep. Community and volunteer involvement may improve your sense of value and worth. If you have depression and you feel that the problem is getting worse or becoming severe, talk to your caregiver about which treatment options are best for you. ACCIDENTS  Accidents are common and can be serious in elderly woman. Prepare your house to prevent accidents. Eliminate throw rugs, place hand bars in bath, shower, and toilet areas. Avoid wearing high heeled shoes or walking on wet, snowy, and icy areas. Limit or stop driving if you have vision or hearing problems, or if you feel you are unsteady with your movements and  reflexes. HEPATITIS C Hepatitis C is a type of viral infection affecting the liver. It is spread mainly through contact with blood from an infected person. It can be treated, but if left untreated, it can lead to severe liver damage over the years. Many people who are infected do not know that the virus is in their blood. If you are a "baby-boomer", it is recommended that you have one screening test for Hepatitis C. IMMUNIZATIONS  Several immunizations are important to consider having during your senior years, including:   Tetanus, diphtheria, and pertussis booster shot.  Influenza every year before the flu season begins.  Pneumonia vaccine.  Shingles vaccine.  Others, as indicated based on your specific needs. Talk to your caregiver about these. Document Released: 09/10/2005 Document Revised: 12/03/2013 Document Reviewed: 05/06/2008 ExitCare Patient Information 2015 [employer]. This information is not intended to replace advice given to you by your health care provider. Make sure you discuss any questions you have with your health care provider. Exercise to Stay Healthy Exercise helps you become and stay healthy. EXERCISE IDEAS AND TIPS Choose exercises that:  You enjoy.  Fit into your day. You do not need to exercise really hard to be healthy. You can do exercises at a slow or medium level and stay healthy. You can:  Stretch before and after working out.  Try yoga, Pilates, or tai chi.  Lift weights.  Walk fast, swim, jog, run, climb stairs, bicycle, dance, or rollerskate.  Take aerobic classes. Exercises that burn about 150 calories:  Running 1  miles in 15 minutes.  Playing volleyball for 45 to 60 minutes.  Washing and waxing a car for 45 to 60 minutes.  Playing touch football for 45 minutes.  Walking 1  miles in 35 minutes.  Pushing a stroller 1  miles in 30 minutes.  Playing basketball for 30 minutes.  Raking leaves for 30 minutes.  Bicycling 5  miles in 30 minutes.  Walking 2 miles in 30 minutes.  Dancing for 30 minutes.  Shoveling snow for 15 minutes.  Swimming laps for 20 minutes.  Walking up stairs for 15 minutes.  Bicycling 4 miles in 15 minutes.  Gardening for 30 to 45 minutes.  Jumping rope for 15 minutes.  Washing windows or floors for 45 to 60 minutes. Document Released: 08/21/2010 Document Revised: 10/11/2011 Document Reviewed: 08/21/2010 ExitCare Patient Information 2015 [employer]. This information is not intended to replace advice given to you by your health care provider. Make sure you discuss any questions you have with your health care provider.  

## 2015-01-15 NOTE — Progress Notes (Signed)
ROSILAND YOUNGSTROM July 30, 1951 [id number]    History:    Presents for annual exam.  Postmenopausal on no HRT. 2000 endometrial polyps benign. Normal Pap and mammogram history. 2012 DEXA -0.6 . 2009 negative colonoscopy. Mother died of breast cancer age 64. Has not had Zostavax.  Past medical history, past surgical history, family history and social history were all reviewed and documented in the EPIC chart. He tired from [employer]. 2 sons both doing well. Father hypertension, brother diabetes.  ROS:  A ROS was performed and pertinent positives and negatives are included.  Exam:  Filed Vitals:   01/15/15 1000  BP: 116/72    General appearance:  Normal Thyroid:  Symmetrical, normal in size, without palpable masses or nodularity. Respiratory  Auscultation:  Clear without wheezing or rhonchi Cardiovascular  Auscultation:  Regular rate, without rubs, murmurs or gallops  Edema/varicosities:  Not grossly evident Abdominal  Soft,nontender, without masses, guarding or rebound.  Liver/spleen:  No organomegaly noted  Hernia:  None appreciated  Skin  Inspection:  Grossly normal   Breasts: Examined lying and sitting.     Right: Without masses, retractions, discharge or axillary adenopathy.     Left: Without masses, retractions, discharge or axillary adenopathy. Gentitourinary   Inguinal/mons:  Normal without inguinal adenopathy  External genitalia:  Normal  BUS/Urethra/Skene's glands:  Normal  Vagina:  Normal  Cervix:  Normal  Uterus:   normal in size, shape and contour.  Midline and mobile  Adnexa/parametria:     Rt: Without masses or tenderness.   Lt: Without masses or tenderness.  Anus and perineum: Normal  Digital rectal exam: Normal sphincter tone without palpated masses or tenderness  Assessment/Plan:  64 y.o. MBF G2P2 for annual exam with no complaints.  Postmenopausal/no HRT/no bleeding  Plan: Repeat DEXA, will schedule. Home safety, fall prevention and importance of regular daily  weightbearing exercise reviewed. SBE's, continue annual mammogram 3-D tomography reviewed and encouraged history of dense breasts. Vitamin D 2000 daily encouraged. CBC, lipid panel, CMP, vitamin D, UA, Pap with HR HPV typing, new screening guidelines reviewed. Encouraged Zostavax  Harrington Challenger WHNP, 1:39 PM 01/15/2015

## 2015-01-22 ENCOUNTER — Other Ambulatory Visit: Payer: Self-pay | Admitting: Gynecology

## 2015-01-22 DIAGNOSIS — Z13820 Encounter for screening for osteoporosis: Secondary | ICD-10-CM

## 2015-02-27 ENCOUNTER — Other Ambulatory Visit: Payer: Self-pay | Admitting: Gynecology

## 2015-02-27 ENCOUNTER — Ambulatory Visit (INDEPENDENT_AMBULATORY_CARE_PROVIDER_SITE_OTHER): Payer: 59

## 2015-02-27 DIAGNOSIS — Z13820 Encounter for screening for osteoporosis: Secondary | ICD-10-CM | POA: Diagnosis not present

## 2015-02-27 DIAGNOSIS — Z780 Asymptomatic menopausal state: Secondary | ICD-10-CM

## 2015-04-06 IMAGING — MG MM DIGITAL SCREENING BILAT
4 series · 4 of 4 positions shown · non-contrast
Comparison: Previous exams.

CLINICAL DATA: Screening.

DIGITAL BILATERAL SCREENING MAMMOGRAM WITH CAD

[R CC]
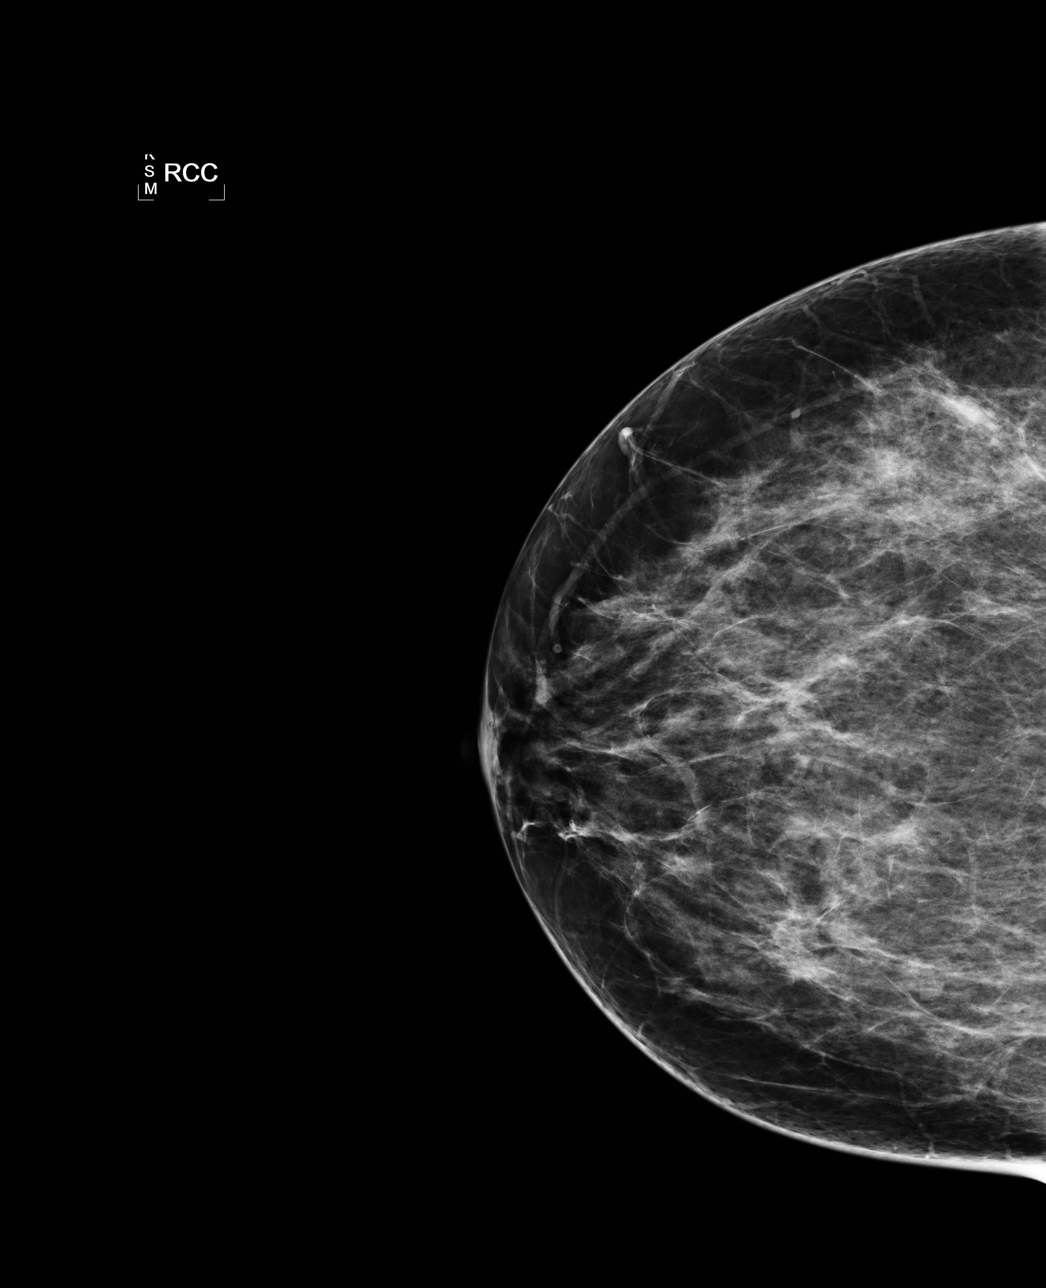

[L CC]
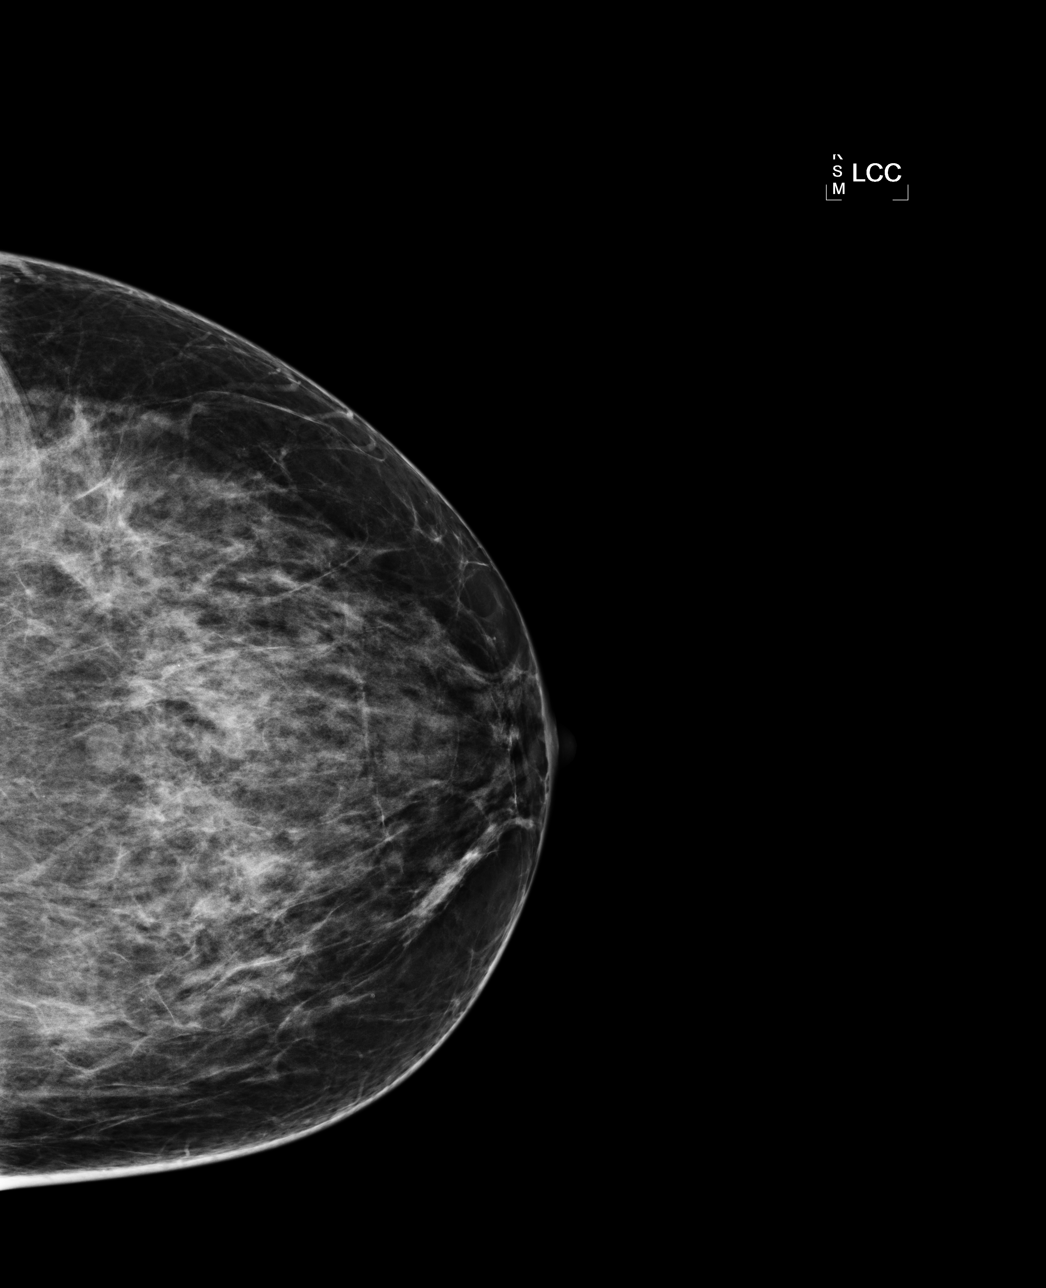

[L MLO]
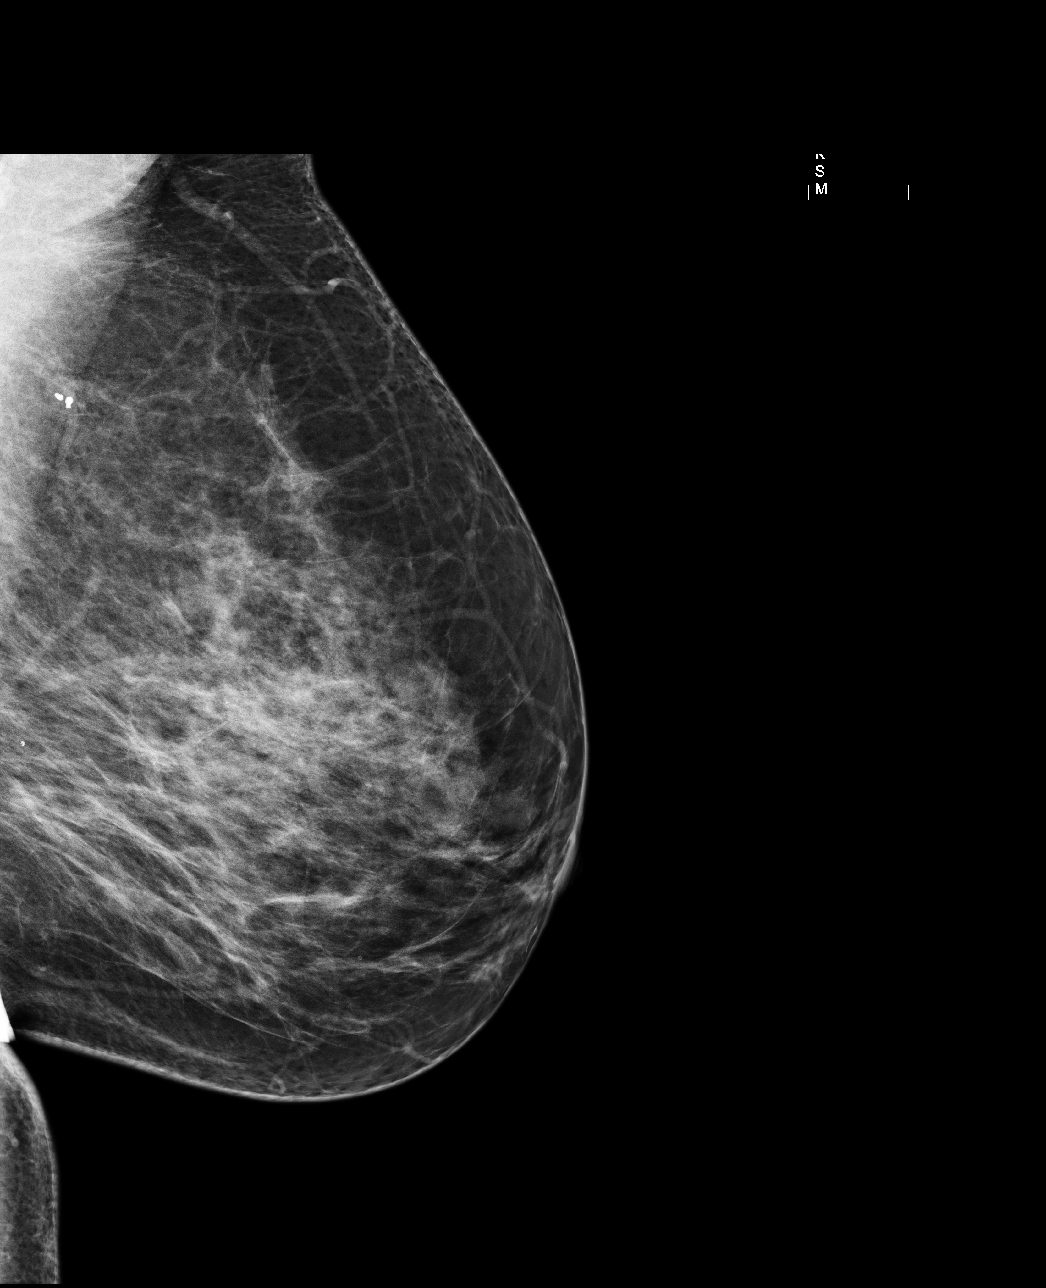

[R MLO]
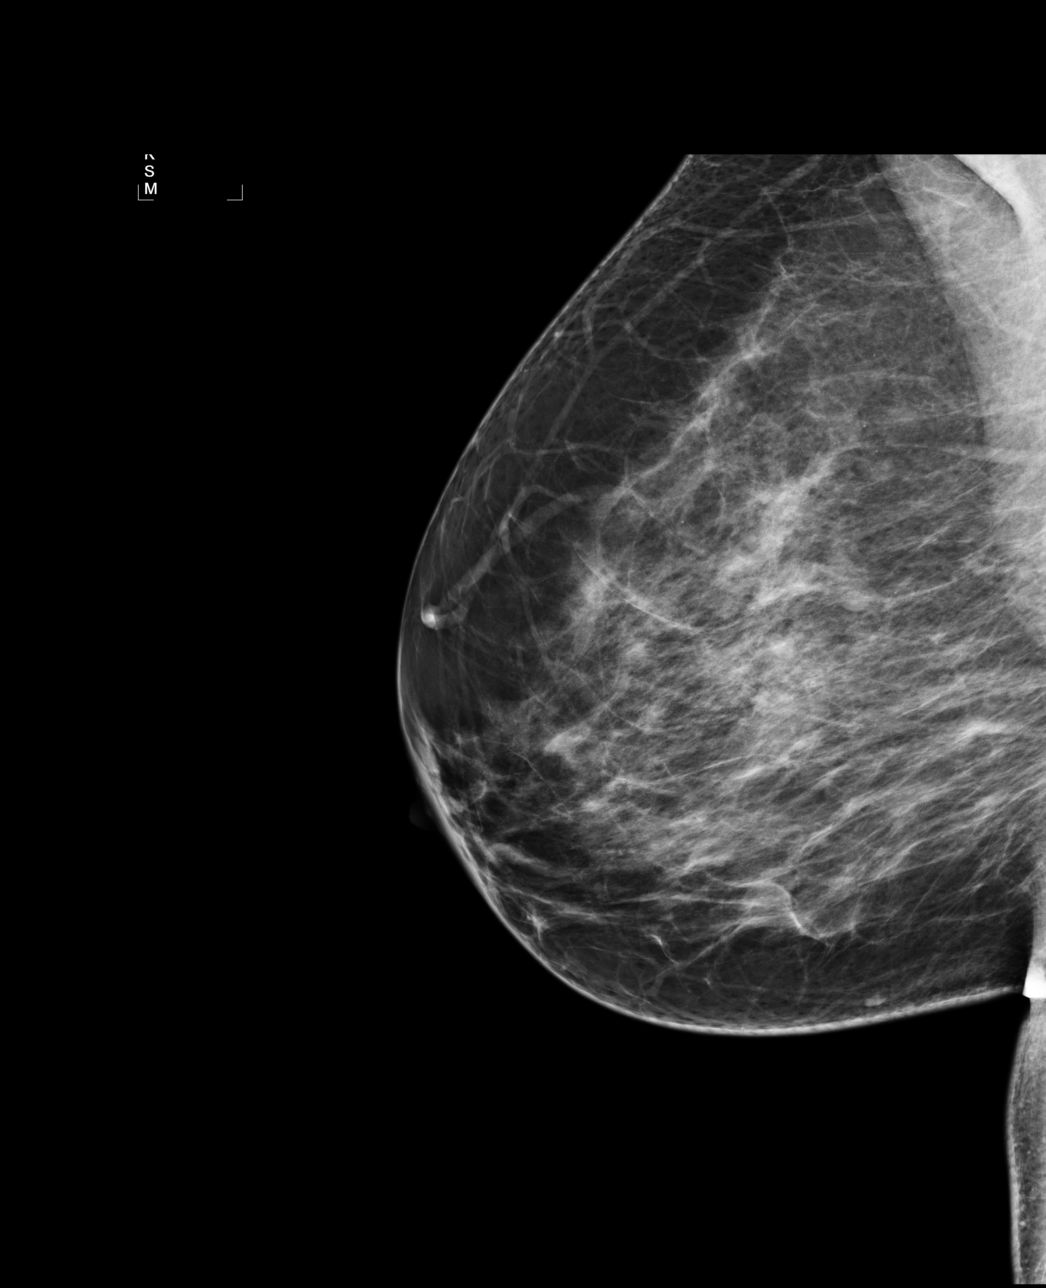

[4 of 4 positions shown; findings below may reference images not displayed]

FINDINGS: ACR Breast Density Category 2: There is a scattered fibroglandular
pattern.

No new suspicious masses, architectural distortion, or
calcifications are present.

Images were processed with CAD.
IMPRESSION: No mammographic evidence of malignancy.

A result letter of this screening mammogram will be mailed directly
to the patient.

RECOMMENDATION:
Screening mammogram in one year. (Code:02-A-T5Y)

BI-RADS CATEGORY 1:  Negative.

## 2015-11-11 ENCOUNTER — Other Ambulatory Visit: Payer: Self-pay

## 2015-11-11 DIAGNOSIS — Z1231 Encounter for screening mammogram for malignant neoplasm of breast: Secondary | ICD-10-CM

## 2015-12-11 ENCOUNTER — Ambulatory Visit
Admission: RE | Admit: 2015-12-11 | Discharge: 2015-12-11 | Disposition: A | Discharge status: Home / Self Care | Payer: 59 | Source: Ambulatory Visit

## 2015-12-11 DIAGNOSIS — Z1231 Encounter for screening mammogram for malignant neoplasm of breast: Secondary | ICD-10-CM

## 2016-01-16 ENCOUNTER — Encounter: Payer: Self-pay | Admitting: Women's Health

## 2016-01-16 ENCOUNTER — Ambulatory Visit (INDEPENDENT_AMBULATORY_CARE_PROVIDER_SITE_OTHER): Payer: 59 | Admitting: Women's Health

## 2016-01-16 VITALS — BP 140/80 | Ht 64.0 in | Wt 172.0 lb

## 2016-01-16 DIAGNOSIS — Z01419 Encounter for gynecological examination (general) (routine) without abnormal findings: Secondary | ICD-10-CM | POA: Diagnosis not present

## 2016-01-16 DIAGNOSIS — Z1329 Encounter for screening for other suspected endocrine disorder: Secondary | ICD-10-CM

## 2016-01-16 DIAGNOSIS — Z13220 Encounter for screening for lipoid disorders: Secondary | ICD-10-CM | POA: Diagnosis not present

## 2016-01-16 LAB — LIPID PANEL
CHOL/HDL RATIO: 1.7 ratio (ref <=5.0)
CHOLESTEROL: 167 mg/dL (ref 125–200)
HDL: 96 mg/dL (ref >=46)
LDL Cholesterol: 53 mg/dL (ref <130)
TRIGLYCERIDES: 91 mg/dL (ref <150)
VLDL: 18 mg/dL (ref <30)

## 2016-01-16 LAB — COMPREHENSIVE METABOLIC PANEL
ALBUMIN: 4.2 g/dL (ref 3.6–5.1)
ALK PHOS: 102 U/L (ref 33–130)
ALT: 12 U/L (ref 6–29)
AST: 17 U/L (ref 10–35)
BUN: 10 mg/dL (ref 7–25)
CALCIUM: 9.1 mg/dL (ref 8.6–10.4)
CO2: 23 mmol/L (ref 20–31)
Chloride: 108 mmol/L (ref 98–110)
Creat: 0.75 mg/dL (ref 0.50–0.99)
GLUCOSE: 94 mg/dL (ref 65–99)
POTASSIUM: 4.4 mmol/L (ref 3.5–5.3)
Sodium: 141 mmol/L (ref 135–146)
TOTAL PROTEIN: 7.2 g/dL (ref 6.1–8.1)
Total Bilirubin: 0.6 mg/dL (ref 0.2–1.2)

## 2016-01-16 LAB — CBC WITH DIFFERENTIAL/PLATELET
BASOS PCT: 1 %
Basophils Absolute: 62 cells/uL (ref 0–200)
EOS ABS: 248 {cells}/uL (ref 15–500)
Eosinophils Relative: 4 %
HEMATOCRIT: 37.1 % (ref 35.0–45.0)
Hemoglobin: 12.7 g/dL (ref 11.7–15.5)
LYMPHS PCT: 39 %
Lymphs Abs: 2418 cells/uL (ref 850–3900)
MCH: 28.5 pg (ref 27.0–33.0)
MCHC: 34.2 g/dL (ref 32.0–36.0)
MCV: 83.2 fL (ref 80.0–100.0)
MONO ABS: 434 {cells}/uL (ref 200–950)
MPV: 10.3 fL (ref 7.5–12.5)
Monocytes Relative: 7 %
Neutro Abs: 3038 cells/uL (ref 1500–7800)
Neutrophils Relative %: 49 %
Platelets: 198 10*3/uL (ref 140–400)
RBC: 4.46 MIL/uL (ref 3.80–5.10)
RDW: 14.6 % (ref 11.0–15.0)
WBC: 6.2 10*3/uL (ref 3.8–10.8)

## 2016-01-16 LAB — TSH: TSH: 1.12 mIU/L

## 2016-01-16 NOTE — Patient Instructions (Signed)

## 2016-01-16 NOTE — Progress Notes (Signed)
Mary Hill 12-25-1950 [id number]    History:    Presents for annual exam.  Postmenopausal on no HRT with no bleeding. 2000 benign endometrial polyps. Normal Pap and mammogram history. Has not received Zostavax. 2016 normal DEXA. 2009 negative colonoscopy. Mother breast cancer age 65.  Past medical history, past surgical history, family history and social history were all reviewed and documented in the EPIC chart. Retired from [employer]. 2 sons both doing well. Father hypertension, brother diabetes.  ROS:  A ROS was performed and pertinent positives and negatives are included.  Exam:  Filed Vitals:   01/16/16 1103  BP: 140/80    General appearance:  Normal Thyroid:  Symmetrical, normal in size, without palpable masses or nodularity. Respiratory  Auscultation:  Clear without wheezing or rhonchi Cardiovascular  Auscultation:  Regular rate, without rubs, murmurs or gallops  Edema/varicosities:  Not grossly evident Abdominal  Soft,nontender, without masses, guarding or rebound.  Liver/spleen:  No organomegaly noted  Hernia:  None appreciated  Skin  Inspection:  Grossly normal   Breasts: Examined lying and sitting.     Right: Without masses, retractions, discharge or axillary adenopathy.     Left: Without masses, retractions, discharge or axillary adenopathy. Gentitourinary   Inguinal/mons:  Normal without inguinal adenopathy  External genitalia:  Normal  BUS/Urethra/Skene's glands:  Normal  Vagina:  Normal  Cervix:  Normal  Uterus:  Fibroid uterus 8- wk size/stable, shape and contour.  Midline and mobile  Adnexa/parametria:     Rt: Without masses or tenderness.   Lt: Without masses or tenderness.  Anus and perineum: Normal  Digital rectal exam: Normal sphincter tone without palpated masses or tenderness  Assessment/Plan:  65 y.o. MBF G2 P2 for annual exam with no complaints.  Postmenopausal/no HRT/no bleeding Fibroid uterus stable  Plan: SBE's, continue annual 3-D  screening mammogram, calcium rich diet, vitamin D 2000 daily encouraged. Encouraged to increase regular exercise and decrease calories for weight loss as had a 5 pound weight gain in the past year. CBC, lipid panel, CMP, TSH, vitamin D, UA, Pap with HR HPV typing. New screening guidelines reviewed. Zostavax encourage prescription given. Reviewed Pneumovax at age 65.    Harrington ChallengerYOUNG,Shakeya Kerkman J Morrist[facility] HospitalWHNP, 11:24 AM 01/16/2016

## 2016-01-16 NOTE — Addendum Note (Signed)
Addended by: Kem ParkinsonBARNES, Ziyonna Christner on: 01/16/2016 11:32 AM   Modules accepted: Orders

## 2016-01-17 LAB — URINALYSIS W MICROSCOPIC + REFLEX CULTURE
BACTERIA UA: NONE SEEN [HPF]
BILIRUBIN URINE: NEGATIVE
Casts: NONE SEEN [LPF]
Crystals: NONE SEEN [HPF]
Glucose, UA: NEGATIVE
HGB URINE DIPSTICK: NEGATIVE
Ketones, ur: NEGATIVE
Nitrite: NEGATIVE
PROTEIN: NEGATIVE
Specific Gravity, Urine: 1.012 (ref 1.001–1.035)
pH: 6 (ref 5.0–8.0)

## 2016-01-17 LAB — VITAMIN D 25 HYDROXY (VIT D DEFICIENCY, FRACTURES): Vit D, 25-Hydroxy: 32 ng/mL (ref 30–100)

## 2016-01-18 LAB — URINE CULTURE

## 2016-01-21 LAB — PAP, TP IMAGING W/ HPV RNA, RFLX HPV TYPE 16,18/45: HPV mRNA, High Risk: NOT DETECTED

## 2016-11-03 ENCOUNTER — Other Ambulatory Visit: Payer: Self-pay | Admitting: Gynecology

## 2016-11-03 DIAGNOSIS — Z1231 Encounter for screening mammogram for malignant neoplasm of breast: Secondary | ICD-10-CM

## 2016-12-13 ENCOUNTER — Ambulatory Visit: Payer: 59

## 2016-12-16 ENCOUNTER — Ambulatory Visit
Admission: RE | Admit: 2016-12-16 | Discharge: 2016-12-16 | Disposition: A | Discharge status: Home / Self Care | Payer: 59 | Source: Ambulatory Visit | Attending: Gynecology | Admitting: Gynecology

## 2016-12-16 DIAGNOSIS — Z1231 Encounter for screening mammogram for malignant neoplasm of breast: Secondary | ICD-10-CM

## 2016-12-20 ENCOUNTER — Other Ambulatory Visit: Payer: Self-pay | Admitting: Gynecology

## 2016-12-20 DIAGNOSIS — R928 Other abnormal and inconclusive findings on diagnostic imaging of breast: Secondary | ICD-10-CM

## 2016-12-22 ENCOUNTER — Ambulatory Visit
Admission: RE | Admit: 2016-12-22 | Discharge: 2016-12-22 | Disposition: A | Discharge status: Home / Self Care | Payer: 59 | Source: Ambulatory Visit | Attending: Gynecology | Admitting: Gynecology

## 2016-12-22 DIAGNOSIS — R928 Other abnormal and inconclusive findings on diagnostic imaging of breast: Secondary | ICD-10-CM

## 2017-01-19 ENCOUNTER — Encounter: Payer: Self-pay | Admitting: Women's Health

## 2017-01-19 ENCOUNTER — Ambulatory Visit (INDEPENDENT_AMBULATORY_CARE_PROVIDER_SITE_OTHER): Payer: 59 | Admitting: Women's Health

## 2017-01-19 VITALS — BP 158/70 | Ht 62.5 in | Wt 173.2 lb

## 2017-01-19 DIAGNOSIS — Z01419 Encounter for gynecological examination (general) (routine) without abnormal findings: Secondary | ICD-10-CM | POA: Diagnosis not present

## 2017-01-19 DIAGNOSIS — N841 Polyp of cervix uteri: Secondary | ICD-10-CM

## 2017-01-19 NOTE — Addendum Note (Signed)
Addended by: Richardson ChiquitoWILKINSON, Cortavius Montesinos S on: 01/19/2017 03:36 PM   Modules accepted: Orders

## 2017-01-19 NOTE — Progress Notes (Signed)
Mary Hill 13-May-1951 [id number]    History:    Presents for annual exam. Postmenopausal on no HRT with no bleeding. History of an endometrial polyp. Abnormal Pap many years ago with normal Paps after. Normal mammogram history. Negative colonoscopy 2009. 2016 normal DEXA. Has had Zostavax. Exercising 30 minutes daily.   Past medical history, past surgical history, family history and social history were all reviewed and documented in the EPIC chart. Retired from [employer], 2 sons. Father hypertension and diabetes.  ROS:  A ROS was performed and pertinent positives and negatives are included.  Exam:  Vitals:   01/19/17 1433  BP: (!) 158/70  Weight: 173 lb 3.2 oz (78.6 kg)  Height: 5' 2.5" (1.588 m)   Body mass index is 31.17 kg/m.   General appearance:  Normal Thyroid:  Symmetrical, normal in size, without palpable masses or nodularity. Respiratory  Auscultation:  Clear without wheezing or rhonchi Cardiovascular  Auscultation:  Regular rate, without rubs, murmurs or gallops  Edema/varicosities:  Not grossly evident Abdominal  Soft,nontender, without masses, guarding or rebound.  Liver/spleen:  No organomegaly noted  Hernia:  None appreciated  Skin  Inspection:  Grossly normal   Breasts: Examined lying and sitting.     Right: Without masses, retractions, discharge or axillary adenopathy.     Left: Without masses, retractions, discharge or axillary adenopathy. Gentitourinary   Inguinal/mons:  Normal without inguinal adenopathy  External genitalia:  Normal  BUS/Urethra/Skene's glands:  Normal  Vagina:  Normal  Cervix: 1 cm endocervical polyp removed with ease   Uterus:  normal in size, shape and contour.  Midline and mobile  Adnexa/parametria:     Rt: Without masses or tenderness.   Lt: Without masses or tenderness.  Anus and perineum: Normal  Digital rectal exam: Normal sphincter tone without palpated masses or tenderness  Assessment/Plan:  66 y.o. MBF G3 P2  for annual  exam with no complaints.   Postmenopausal/no HRT/no bleeding Endocervical polyp removed and sent for biopsy Blood pressure elevated today 158/70 Labs-primary care  Plan: Reviewed plan: Reviewed blood pressure elevated, will check away from office if continues greater than 130/80 instructed to follow-up with primary care. SBE's, continue annual screening mammogram, calcium rich diet, vitamin D 2000 daily encouraged. Home safety, fall prevention and importance of continuing weightbearing exercise. Repeat DEXA next year. Endocervical polyp biopsy pending. Instructed to call if any spotting or bleeding. Follow-up for pneumonia vaccine at primary care.  Harrington Challengerancy J Yancey Pedley [facility] FairhillWHNP, 3:10 PM 01/19/2017

## 2017-01-19 NOTE — Addendum Note (Signed)
Addended by: Richardson ChiquitoWILKINSON, KARI S on: 01/19/2017 03:20 PM   Modules accepted: Orders

## 2017-01-19 NOTE — Addendum Note (Signed)
Addended by: Richardson ChiquitoWILKINSON, Makeisha Jentsch S on: 01/19/2017 03:34 PM   Modules accepted: Orders

## 2017-01-19 NOTE — Patient Instructions (Signed)
Health Maintenance for Postmenopausal Women Menopause is a normal process in which your reproductive ability comes to an end. This process happens gradually over a span of months to years, usually between the ages of 22 and 9. Menopause is complete when you have missed 12 consecutive menstrual periods. It is important to talk with your health care provider about some of the most common conditions that affect postmenopausal women, such as heart disease, cancer, and bone loss (osteoporosis). Adopting a healthy lifestyle and getting preventive care can help to promote your health and wellness. Those actions can also lower your chances of developing some of these common conditions. What should I know about menopause? During menopause, you may experience a number of symptoms, such as:  Moderate-to-severe hot flashes.  Night sweats.  Decrease in sex drive.  Mood swings.  Headaches.  Tiredness.  Irritability.  Memory problems.  Insomnia.  Choosing to treat or not to treat menopausal changes is an individual decision that you make with your health care provider. What should I know about hormone replacement therapy and supplements? Hormone therapy products are effective for treating symptoms that are associated with menopause, such as hot flashes and night sweats. Hormone replacement carries certain risks, especially as you become older. If you are thinking about using estrogen or estrogen with progestin treatments, discuss the benefits and risks with your health care provider. What should I know about heart disease and stroke? Heart disease, heart attack, and stroke become more likely as you age. This may be due, in part, to the hormonal changes that your body experiences during menopause. These can affect how your body processes dietary fats, triglycerides, and cholesterol. Heart attack and stroke are both medical emergencies. There are many things that you can do to help prevent heart disease  and stroke:  Have your blood pressure checked at least every 1-2 years. High blood pressure causes heart disease and increases the risk of stroke.  If you are 53-22 years old, ask your health care provider if you should take aspirin to prevent a heart attack or a stroke.  Do not use any tobacco products, including cigarettes, chewing tobacco, or electronic cigarettes. If you need help quitting, ask your health care provider.  It is important to eat a healthy diet and maintain a healthy weight. ? Be sure to include plenty of vegetables, fruits, low-fat dairy products, and lean protein. ? Avoid eating foods that are high in solid fats, added sugars, or salt (sodium).  Get regular exercise. This is one of the most important things that you can do for your health. ? Try to exercise for at least 150 minutes each week. The type of exercise that you do should increase your heart rate and make you sweat. This is known as moderate-intensity exercise. ? Try to do strengthening exercises at least twice each week. Do these in addition to the moderate-intensity exercise.  Know your numbers.Ask your health care provider to check your cholesterol and your blood glucose. Continue to have your blood tested as directed by your health care provider.  What should I know about cancer screening? There are several types of cancer. Take the following steps to reduce your risk and to catch any cancer development as early as possible. Breast Cancer  Practice breast self-awareness. ? This means understanding how your breasts normally appear and feel. ? It also means doing regular breast self-exams. Let your health care provider know about any changes, no matter how small.  If you are 40  or older, have a clinician do a breast exam (clinical breast exam or CBE) every year. Depending on your age, family history, and medical history, it may be recommended that you also have a yearly breast X-ray (mammogram).  If you  have a family history of breast cancer, talk with your health care provider about genetic screening.  If you are at high risk for breast cancer, talk with your health care provider about having an MRI and a mammogram every year.  Breast cancer (BRCA) gene test is recommended for women who have family members with BRCA-related cancers. Results of the assessment will determine the need for genetic counseling and BRCA1 and for BRCA2 testing. BRCA-related cancers include these types: ? Breast. This occurs in males or females. ? Ovarian. ? Tubal. This may also be called fallopian tube cancer. ? Cancer of the abdominal or pelvic lining (peritoneal cancer). ? Prostate. ? Pancreatic.  Cervical, Uterine, and Ovarian Cancer Your health care provider may recommend that you be screened regularly for cancer of the pelvic organs. These include your ovaries, uterus, and vagina. This screening involves a pelvic exam, which includes checking for microscopic changes to the surface of your cervix (Pap test).  For women ages 21-65, health care providers may recommend a pelvic exam and a Pap test every three years. For women ages 79-65, they may recommend the Pap test and pelvic exam, combined with testing for human papilloma virus (HPV), every five years. Some types of HPV increase your risk of cervical cancer. Testing for HPV may also be done on women of any age who have unclear Pap test results.  Other health care providers may not recommend any screening for nonpregnant women who are considered low risk for pelvic cancer and have no symptoms. Ask your health care provider if a screening pelvic exam is right for you.  If you have had past treatment for cervical cancer or a condition that could lead to cancer, you need Pap tests and screening for cancer for at least 20 years after your treatment. If Pap tests have been discontinued for you, your risk factors (such as having a new sexual partner) need to be  reassessed to determine if you should start having screenings again. Some women have medical problems that increase the chance of getting cervical cancer. In these cases, your health care provider may recommend that you have screening and Pap tests more often.  If you have a family history of uterine cancer or ovarian cancer, talk with your health care provider about genetic screening.  If you have vaginal bleeding after reaching menopause, tell your health care provider.  There are currently no reliable tests available to screen for ovarian cancer.  Lung Cancer Lung cancer screening is recommended for adults 69-62 years old who are at high risk for lung cancer because of a history of smoking. A yearly low-dose CT scan of the lungs is recommended if you:  Currently smoke.  Have a history of at least 30 pack-years of smoking and you currently smoke or have quit within the past 15 years. A pack-year is smoking an average of one pack of cigarettes per day for one year.  Yearly screening should:  Continue until it has been 15 years since you quit.  Stop if you develop a health problem that would prevent you from having lung cancer treatment.  Colorectal Cancer  This type of cancer can be detected and can often be prevented.  Routine colorectal cancer screening usually begins at  age 42 and continues through age 45.  If you have risk factors for colon cancer, your health care provider may recommend that you be screened at an earlier age.  If you have a family history of colorectal cancer, talk with your health care provider about genetic screening.  Your health care provider may also recommend using home test kits to check for hidden blood in your stool.  A small camera at the end of a tube can be used to examine your colon directly (sigmoidoscopy or colonoscopy). This is done to check for the earliest forms of colorectal cancer.  Direct examination of the colon should be repeated every  5-10 years until age 71. However, if early forms of precancerous polyps or small growths are found or if you have a family history or genetic risk for colorectal cancer, you may need to be screened more often.  Skin Cancer  Check your skin from head to toe regularly.  Monitor any moles. Be sure to tell your health care provider: ? About any new moles or changes in moles, especially if there is a change in a mole's shape or color. ? If you have a mole that is larger than the size of a pencil eraser.  If any of your family members has a history of skin cancer, especially at a young age, talk with your health care provider about genetic screening.  Always use sunscreen. Apply sunscreen liberally and repeatedly throughout the day.  Whenever you are outside, protect yourself by wearing long sleeves, pants, a wide-brimmed hat, and sunglasses.  What should I know about osteoporosis? Osteoporosis is a condition in which bone destruction happens more quickly than new bone creation. After menopause, you may be at an increased risk for osteoporosis. To help prevent osteoporosis or the bone fractures that can happen because of osteoporosis, the following is recommended:  If you are 46-71 years old, get at least 1,000 mg of calcium and at least 600 mg of vitamin D per day.  If you are older than age 55 but younger than age 65, get at least 1,200 mg of calcium and at least 600 mg of vitamin D per day.  If you are older than age 54, get at least 1,200 mg of calcium and at least 800 mg of vitamin D per day.  Smoking and excessive alcohol intake increase the risk of osteoporosis. Eat foods that are rich in calcium and vitamin D, and do weight-bearing exercises several times each week as directed by your health care provider. What should I know about how menopause affects my mental health? Depression may occur at any age, but it is more common as you become older. Common symptoms of depression  include:  Low or sad mood.  Changes in sleep patterns.  Changes in appetite or eating patterns.  Feeling an overall lack of motivation or enjoyment of activities that you previously enjoyed.  Frequent crying spells.  Talk with your health care provider if you think that you are experiencing depression. What should I know about immunizations? It is important that you get and maintain your immunizations. These include:  Tetanus, diphtheria, and pertussis (Tdap) booster vaccine.  Influenza every year before the flu season begins.  Pneumonia vaccine.  Shingles vaccine.  Your health care provider may also recommend other immunizations. This information is not intended to replace advice given to you by your health care provider. Make sure you discuss any questions you have with your health care provider. Document Released: 09/10/2005  Document Revised: 02/06/2016 Document Reviewed: 04/22/2015 Elsevier Interactive Patient Education  2018 [employer]  

## 2017-01-21 LAB — PATHOLOGY

## 2017-03-02 ENCOUNTER — Encounter: Payer: Self-pay | Admitting: Women's Health

## 2017-03-02 ENCOUNTER — Ambulatory Visit (INDEPENDENT_AMBULATORY_CARE_PROVIDER_SITE_OTHER): Payer: 59 | Admitting: Women's Health

## 2017-03-02 VITALS — BP 130/76

## 2017-03-02 DIAGNOSIS — B373 Candidiasis of vulva and vagina: Secondary | ICD-10-CM | POA: Diagnosis not present

## 2017-03-02 DIAGNOSIS — B3731 Acute candidiasis of vulva and vagina: Secondary | ICD-10-CM

## 2017-03-02 DIAGNOSIS — N898 Other specified noninflammatory disorders of vagina: Secondary | ICD-10-CM

## 2017-03-02 DIAGNOSIS — L298 Other pruritus: Secondary | ICD-10-CM

## 2017-03-02 LAB — WET PREP FOR TRICH, YEAST, CLUE
Clue Cells Wet Prep HPF POC: NONE SEEN
Trich, Wet Prep: NONE SEEN

## 2017-03-02 MED ORDER — FLUCONAZOLE 150 MG PO TABS: 150.0000 mg | ORAL_TABLET | Freq: Once | ORAL | 1 refills | Status: AC

## 2017-03-02 NOTE — Patient Instructions (Signed)

## 2017-03-02 NOTE — Progress Notes (Signed)
Presents with complaint of vaginal irritation with itching, no visible discharge, odor for the past several days. Denies urinary symptoms, abdominal pain or fever. Exercises most days of the week. Postmenopausal/no HRT/no bleeding.  Mild arthritis only.  Exam: Appears well. External genitalia extremely erythematous at introitus, speculum exam vaginal walls erythematous, white curdy discharge, no visible blood, wet prep positive for moderate yeast.  Yeast vaginitis  Plan: Diflucan 150 times one dose with refill. Instructed to call if no relief of symptoms. Yeast prevention discussed.

## 2017-11-22 ENCOUNTER — Other Ambulatory Visit: Payer: Self-pay | Admitting: Women's Health

## 2017-11-22 DIAGNOSIS — Z1231 Encounter for screening mammogram for malignant neoplasm of breast: Secondary | ICD-10-CM

## 2017-12-19 ENCOUNTER — Ambulatory Visit
Admission: RE | Admit: 2017-12-19 | Discharge: 2017-12-19 | Disposition: A | Discharge status: Home / Self Care | Payer: Medicare HMO | Source: Ambulatory Visit | Attending: Women's Health | Admitting: Women's Health

## 2017-12-19 DIAGNOSIS — Z1231 Encounter for screening mammogram for malignant neoplasm of breast: Secondary | ICD-10-CM

## 2017-12-20 ENCOUNTER — Other Ambulatory Visit: Payer: Self-pay | Admitting: Women's Health

## 2017-12-20 DIAGNOSIS — R928 Other abnormal and inconclusive findings on diagnostic imaging of breast: Secondary | ICD-10-CM

## 2017-12-28 ENCOUNTER — Ambulatory Visit
Admission: RE | Admit: 2017-12-28 | Discharge: 2017-12-28 | Disposition: A | Discharge status: Home / Self Care | Payer: Medicare HMO | Source: Ambulatory Visit | Attending: Women's Health | Admitting: Women's Health

## 2017-12-28 ENCOUNTER — Other Ambulatory Visit: Payer: Self-pay | Admitting: Women's Health

## 2017-12-28 DIAGNOSIS — R921 Mammographic calcification found on diagnostic imaging of breast: Secondary | ICD-10-CM

## 2017-12-28 DIAGNOSIS — R928 Other abnormal and inconclusive findings on diagnostic imaging of breast: Secondary | ICD-10-CM

## 2017-12-28 DIAGNOSIS — N6002 Solitary cyst of left breast: Secondary | ICD-10-CM | POA: Diagnosis not present

## 2018-01-24 ENCOUNTER — Ambulatory Visit (INDEPENDENT_AMBULATORY_CARE_PROVIDER_SITE_OTHER): Payer: 59 | Admitting: Women's Health

## 2018-01-24 ENCOUNTER — Encounter: Payer: Self-pay | Admitting: Women's Health

## 2018-01-24 VITALS — BP 130/80 | Ht 64.0 in | Wt 168.0 lb

## 2018-01-24 DIAGNOSIS — Z01419 Encounter for gynecological examination (general) (routine) without abnormal findings: Secondary | ICD-10-CM | POA: Diagnosis not present

## 2018-01-24 DIAGNOSIS — R8279 Other abnormal findings on microbiological examination of urine: Secondary | ICD-10-CM | POA: Diagnosis not present

## 2018-01-24 NOTE — Patient Instructions (Signed)
Health Maintenance for Postmenopausal Women Menopause is a normal process in which your reproductive ability comes to an end. This process happens gradually over a span of months to years, usually between the ages of 22 and 9. Menopause is complete when you have missed 12 consecutive menstrual periods. It is important to talk with your health care provider about some of the most common conditions that affect postmenopausal women, such as heart disease, cancer, and bone loss (osteoporosis). Adopting a healthy lifestyle and getting preventive care can help to promote your health and wellness. Those actions can also lower your chances of developing some of these common conditions. What should I know about menopause? During menopause, you may experience a number of symptoms, such as:  Moderate-to-severe hot flashes.  Night sweats.  Decrease in sex drive.  Mood swings.  Headaches.  Tiredness.  Irritability.  Memory problems.  Insomnia.  Choosing to treat or not to treat menopausal changes is an individual decision that you make with your health care provider. What should I know about hormone replacement therapy and supplements? Hormone therapy products are effective for treating symptoms that are associated with menopause, such as hot flashes and night sweats. Hormone replacement carries certain risks, especially as you become older. If you are thinking about using estrogen or estrogen with progestin treatments, discuss the benefits and risks with your health care provider. What should I know about heart disease and stroke? Heart disease, heart attack, and stroke become more likely as you age. This may be due, in part, to the hormonal changes that your body experiences during menopause. These can affect how your body processes dietary fats, triglycerides, and cholesterol. Heart attack and stroke are both medical emergencies. There are many things that you can do to help prevent heart disease  and stroke:  Have your blood pressure checked at least every 1-2 years. High blood pressure causes heart disease and increases the risk of stroke.  If you are 53-22 years old, ask your health care provider if you should take aspirin to prevent a heart attack or a stroke.  Do not use any tobacco products, including cigarettes, chewing tobacco, or electronic cigarettes. If you need help quitting, ask your health care provider.  It is important to eat a healthy diet and maintain a healthy weight. ? Be sure to include plenty of vegetables, fruits, low-fat dairy products, and lean protein. ? Avoid eating foods that are high in solid fats, added sugars, or salt (sodium).  Get regular exercise. This is one of the most important things that you can do for your health. ? Try to exercise for at least 150 minutes each week. The type of exercise that you do should increase your heart rate and make you sweat. This is known as moderate-intensity exercise. ? Try to do strengthening exercises at least twice each week. Do these in addition to the moderate-intensity exercise.  Know your numbers.Ask your health care provider to check your cholesterol and your blood glucose. Continue to have your blood tested as directed by your health care provider.  What should I know about cancer screening? There are several types of cancer. Take the following steps to reduce your risk and to catch any cancer development as early as possible. Breast Cancer  Practice breast self-awareness. ? This means understanding how your breasts normally appear and feel. ? It also means doing regular breast self-exams. Let your health care provider know about any changes, no matter how small.  If you are 40  or older, have a clinician do a breast exam (clinical breast exam or CBE) every year. Depending on your age, family history, and medical history, it may be recommended that you also have a yearly breast X-ray (mammogram).  If you  have a family history of breast cancer, talk with your health care provider about genetic screening.  If you are at high risk for breast cancer, talk with your health care provider about having an MRI and a mammogram every year.  Breast cancer (BRCA) gene test is recommended for women who have family members with BRCA-related cancers. Results of the assessment will determine the need for genetic counseling and BRCA1 and for BRCA2 testing. BRCA-related cancers include these types: ? Breast. This occurs in males or females. ? Ovarian. ? Tubal. This may also be called fallopian tube cancer. ? Cancer of the abdominal or pelvic lining (peritoneal cancer). ? Prostate. ? Pancreatic.  Cervical, Uterine, and Ovarian Cancer Your health care provider may recommend that you be screened regularly for cancer of the pelvic organs. These include your ovaries, uterus, and vagina. This screening involves a pelvic exam, which includes checking for microscopic changes to the surface of your cervix (Pap test).  For women ages 21-65, health care providers may recommend a pelvic exam and a Pap test every three years. For women ages 79-65, they may recommend the Pap test and pelvic exam, combined with testing for human papilloma virus (HPV), every five years. Some types of HPV increase your risk of cervical cancer. Testing for HPV may also be done on women of any age who have unclear Pap test results.  Other health care providers may not recommend any screening for nonpregnant women who are considered low risk for pelvic cancer and have no symptoms. Ask your health care provider if a screening pelvic exam is right for you.  If you have had past treatment for cervical cancer or a condition that could lead to cancer, you need Pap tests and screening for cancer for at least 20 years after your treatment. If Pap tests have been discontinued for you, your risk factors (such as having a new sexual partner) need to be  reassessed to determine if you should start having screenings again. Some women have medical problems that increase the chance of getting cervical cancer. In these cases, your health care provider may recommend that you have screening and Pap tests more often.  If you have a family history of uterine cancer or ovarian cancer, talk with your health care provider about genetic screening.  If you have vaginal bleeding after reaching menopause, tell your health care provider.  There are currently no reliable tests available to screen for ovarian cancer.  Lung Cancer Lung cancer screening is recommended for adults 69-62 years old who are at high risk for lung cancer because of a history of smoking. A yearly low-dose CT scan of the lungs is recommended if you:  Currently smoke.  Have a history of at least 30 pack-years of smoking and you currently smoke or have quit within the past 15 years. A pack-year is smoking an average of one pack of cigarettes per day for one year.  Yearly screening should:  Continue until it has been 15 years since you quit.  Stop if you develop a health problem that would prevent you from having lung cancer treatment.  Colorectal Cancer  This type of cancer can be detected and can often be prevented.  Routine colorectal cancer screening usually begins at  age 42 and continues through age 45.  If you have risk factors for colon cancer, your health care provider may recommend that you be screened at an earlier age.  If you have a family history of colorectal cancer, talk with your health care provider about genetic screening.  Your health care provider may also recommend using home test kits to check for hidden blood in your stool.  A small camera at the end of a tube can be used to examine your colon directly (sigmoidoscopy or colonoscopy). This is done to check for the earliest forms of colorectal cancer.  Direct examination of the colon should be repeated every  5-10 years until age 71. However, if early forms of precancerous polyps or small growths are found or if you have a family history or genetic risk for colorectal cancer, you may need to be screened more often.  Skin Cancer  Check your skin from head to toe regularly.  Monitor any moles. Be sure to tell your health care provider: ? About any new moles or changes in moles, especially if there is a change in a mole's shape or color. ? If you have a mole that is larger than the size of a pencil eraser.  If any of your family members has a history of skin cancer, especially at a young age, talk with your health care provider about genetic screening.  Always use sunscreen. Apply sunscreen liberally and repeatedly throughout the day.  Whenever you are outside, protect yourself by wearing long sleeves, pants, a wide-brimmed hat, and sunglasses.  What should I know about osteoporosis? Osteoporosis is a condition in which bone destruction happens more quickly than new bone creation. After menopause, you may be at an increased risk for osteoporosis. To help prevent osteoporosis or the bone fractures that can happen because of osteoporosis, the following is recommended:  If you are 46-71 years old, get at least 1,000 mg of calcium and at least 600 mg of vitamin D per day.  If you are older than age 55 but younger than age 65, get at least 1,200 mg of calcium and at least 600 mg of vitamin D per day.  If you are older than age 54, get at least 1,200 mg of calcium and at least 800 mg of vitamin D per day.  Smoking and excessive alcohol intake increase the risk of osteoporosis. Eat foods that are rich in calcium and vitamin D, and do weight-bearing exercises several times each week as directed by your health care provider. What should I know about how menopause affects my mental health? Depression may occur at any age, but it is more common as you become older. Common symptoms of depression  include:  Low or sad mood.  Changes in sleep patterns.  Changes in appetite or eating patterns.  Feeling an overall lack of motivation or enjoyment of activities that you previously enjoyed.  Frequent crying spells.  Talk with your health care provider if you think that you are experiencing depression. What should I know about immunizations? It is important that you get and maintain your immunizations. These include:  Tetanus, diphtheria, and pertussis (Tdap) booster vaccine.  Influenza every year before the flu season begins.  Pneumonia vaccine.  Shingles vaccine.  Your health care provider may also recommend other immunizations. This information is not intended to replace advice given to you by your health care provider. Make sure you discuss any questions you have with your health care provider. Document Released: 09/10/2005  Document Revised: 02/06/2016 Document Reviewed: 04/22/2015 Elsevier Interactive Patient Education  2018 [employer]  

## 2018-01-24 NOTE — Progress Notes (Signed)
Mary Hill [birth year] [id number]    History:    Presents for breast and pelvic exam with no complaints.  Postmenopausal no HRT with no bleeding.  Had one abnormal Pap greater than 25 years ago normal on follow-up.  Normal mammogram history but has a six-month follow-up scheduled for December.  2009- colonoscopy.  2016 normal DEXA.  History of arthritis.  Current on vaccines.  Past medical history, past surgical history, family history and social history were all reviewed and documented in the EPIC chart.  Retired from [employer].  2 sons both doing well.  Father hypertension and diabetes.  ROS:  A ROS was performed and pertinent positives and negatives are included.  Exam:  Vitals:   01/24/18 1129  BP: 130/80  Weight: 168 lb (76.2 kg)  Height: 5\' 4"  (1.626 m)   Body mass index is 28.84 kg/m.   General appearance:  Normal Thyroid:  Symmetrical, normal in size, without palpable masses or nodularity. Respiratory  Auscultation:  Clear without wheezing or rhonchi Cardiovascular  Auscultation:  Regular rate, without rubs, murmurs or gallops  Edema/varicosities:  Not grossly evident Abdominal  Soft,nontender, without masses, guarding or rebound.  Liver/spleen:  No organomegaly noted  Hernia:  None appreciated  Skin  Inspection:  Grossly normal   Breasts: Examined lying and sitting.     Right: Without masses, retractions, discharge or axillary adenopathy.     Left: Without masses, retractions, discharge or axillary adenopathy. Gentitourinary   Inguinal/mons:  Normal without inguinal adenopathy  External genitalia:  Normal  BUS/Urethra/Skene's glands:  Normal  Vagina:  Normal  Cervix:  Normal  Uterus:  normal in size, shape and contour.  Midline and mobile  Adnexa/parametria:     Rt: Without masses or tenderness.   Lt: Without masses or tenderness.  Anus and perineum: Normal  Digital rectal exam: Normal sphincter tone without palpated masses or tenderness  Assessment/Plan:   67 y.o. MB F G2 P2 for breast and pelvic exam with no complaints.  Postmenopausal/no HRT/no bleeding Normal DEXA [facility]-primary care  Plan: Instructed to call GI for follow-up 10 your colonoscopy.  SBE's, continue annual screening mammograms and 114-month follow-up which is scheduled.  Continue exercise, weightbearing and balance type exercise encouraged.  Home safety, fall prevention discussed.  MND 2000 daily encouraged.  Pap screening guidelines reviewed.    Harrington Challengerancy J Airon Sahni [facility] HealthcareWHNP, 12:00 PM 01/24/2018

## 2018-01-26 LAB — URINALYSIS, COMPLETE W/RFL CULTURE
BILIRUBIN URINE: NEGATIVE
Bacteria, UA: NONE SEEN /HPF
Glucose, UA: NEGATIVE
Hgb urine dipstick: NEGATIVE
Hyaline Cast: NONE SEEN /LPF
KETONES UR: NEGATIVE
NITRITES URINE, INITIAL: NEGATIVE
PH: 6.5 (ref 5.0–8.0)
Protein, ur: NEGATIVE
SPECIFIC GRAVITY, URINE: 1.006 (ref 1.001–1.03)

## 2018-01-26 LAB — URINE CULTURE
MICRO NUMBER: 90762882
SPECIMEN QUALITY: ADEQUATE

## 2018-01-26 LAB — CULTURE INDICATED

## 2018-08-09 ENCOUNTER — Other Ambulatory Visit: Payer: Self-pay | Admitting: Women's Health

## 2018-08-09 ENCOUNTER — Ambulatory Visit
Admission: RE | Admit: 2018-08-09 | Discharge: 2018-08-09 | Disposition: A | Discharge status: Home / Self Care | Payer: Medicare HMO | Source: Ambulatory Visit | Attending: Women's Health | Admitting: Women's Health

## 2018-08-09 DIAGNOSIS — R921 Mammographic calcification found on diagnostic imaging of breast: Secondary | ICD-10-CM

## 2019-03-02 ENCOUNTER — Ambulatory Visit
Admission: RE | Admit: 2019-03-02 | Discharge: 2019-03-02 | Disposition: A | Discharge status: Home / Self Care | Payer: Medicare HMO | Source: Ambulatory Visit | Attending: Women's Health | Admitting: Women's Health

## 2019-03-02 ENCOUNTER — Other Ambulatory Visit: Payer: Self-pay

## 2019-03-02 DIAGNOSIS — R921 Mammographic calcification found on diagnostic imaging of breast: Secondary | ICD-10-CM | POA: Diagnosis not present

## 2019-05-09 ENCOUNTER — Encounter: Payer: Self-pay | Admitting: Gynecology

## 2019-06-07 DIAGNOSIS — H52203 Unspecified astigmatism, bilateral: Secondary | ICD-10-CM | POA: Diagnosis not present

## 2019-06-07 DIAGNOSIS — H524 Presbyopia: Secondary | ICD-10-CM | POA: Diagnosis not present

## 2019-07-11 DIAGNOSIS — Z0100 Encounter for examination of eyes and vision without abnormal findings: Secondary | ICD-10-CM | POA: Diagnosis not present

## 2019-07-11 DIAGNOSIS — H524 Presbyopia: Secondary | ICD-10-CM | POA: Diagnosis not present

## 2020-01-16 ENCOUNTER — Other Ambulatory Visit: Payer: Self-pay | Admitting: Women's Health

## 2020-01-16 ENCOUNTER — Other Ambulatory Visit: Payer: Self-pay | Admitting: Nurse Practitioner

## 2020-01-16 ENCOUNTER — Other Ambulatory Visit: Payer: Self-pay

## 2020-01-16 DIAGNOSIS — R921 Mammographic calcification found on diagnostic imaging of breast: Secondary | ICD-10-CM

## 2020-01-28 ENCOUNTER — Other Ambulatory Visit: Payer: Self-pay

## 2020-01-29 ENCOUNTER — Encounter: Payer: Self-pay | Admitting: Nurse Practitioner

## 2020-01-29 ENCOUNTER — Ambulatory Visit (INDEPENDENT_AMBULATORY_CARE_PROVIDER_SITE_OTHER): Payer: Medicare HMO | Admitting: Nurse Practitioner

## 2020-01-29 VITALS — BP 130/70 | Ht 62.0 in | Wt 168.0 lb

## 2020-01-29 DIAGNOSIS — Z01419 Encounter for gynecological examination (general) (routine) without abnormal findings: Secondary | ICD-10-CM

## 2020-01-29 NOTE — Patient Instructions (Addendum)
Schedule colonoscopy!   Health Maintenance After Age 69 After age 84, you are at a higher risk for certain long-term diseases and infections as well as injuries from falls. Falls are a major cause of broken bones and head injuries in people who are older than age 58. Getting regular preventive care can help to keep you healthy and well. Preventive care includes getting regular testing and making lifestyle changes as recommended by your health care provider. Talk with your health care provider about:  Which screenings and tests you should have. A screening is a test that checks for a disease when you have no symptoms.  A diet and exercise plan that is right for you. What should I know about screenings and tests to prevent falls? Screening and testing are the best ways to find a health problem early. Early diagnosis and treatment give you the best chance of managing medical conditions that are common after age 6. Certain conditions and lifestyle choices may make you more likely to have a fall. Your health care provider may recommend:  Regular vision checks. Poor vision and conditions such as cataracts can make you more likely to have a fall. If you wear glasses, make sure to get your prescription updated if your vision changes.  Medicine review. Work with your health care provider to regularly review all of the medicines you are taking, including over-the-counter medicines. Ask your health care provider about any side effects that may make you more likely to have a fall. Tell your health care provider if any medicines that you take make you feel dizzy or sleepy.  Osteoporosis screening. Osteoporosis is a condition that causes the bones to get weaker. This can make the bones weak and cause them to break more easily.  Blood pressure screening. Blood pressure changes and medicines to control blood pressure can make you feel dizzy.  Strength and balance checks. Your health care provider may recommend  certain tests to check your strength and balance while standing, walking, or changing positions.  Foot health exam. Foot pain and numbness, as well as not wearing proper footwear, can make you more likely to have a fall.  Depression screening. You may be more likely to have a fall if you have a fear of falling, feel emotionally low, or feel unable to do activities that you used to do.  Alcohol use screening. Using too much alcohol can affect your balance and may make you more likely to have a fall. What actions can I take to lower my risk of falls? General instructions  Talk with your health care provider about your risks for falling. Tell your health care provider if: ? You fall. Be sure to tell your health care provider about all falls, even ones that seem minor. ? You feel dizzy, sleepy, or off-balance.  Take over-the-counter and prescription medicines only as told by your health care provider. These include any supplements.  Eat a healthy diet and maintain a healthy weight. A healthy diet includes low-fat dairy products, low-fat (lean) meats, and fiber from whole grains, beans, and lots of fruits and vegetables. Home safety  Remove any tripping hazards, such as rugs, cords, and clutter.  Install safety equipment such as grab bars in bathrooms and safety rails on stairs.  Keep rooms and walkways well-lit. Activity   Follow a regular exercise program to stay fit. This will help you maintain your balance. Ask your health care provider what types of exercise are appropriate for you.  If you  need a cane or walker, use it as recommended by your health care provider.  Wear supportive shoes that have nonskid soles. Lifestyle  Do not drink alcohol if your health care provider tells you not to drink.  If you drink alcohol, limit how much you have: ? 0-1 drink a day for women. ? 0-2 drinks a day for men.  Be aware of how much alcohol is in your drink. In the [place] one drink equals  one typical bottle of beer (12 oz), one-half glass of wine (5 oz), or one shot of hard liquor (1 oz).  Do not use any products that contain nicotine or tobacco, such as cigarettes and e-cigarettes. If you need help quitting, ask your health care provider. Summary  Having a healthy lifestyle and getting preventive care can help to protect your health and wellness after age 43.  Screening and testing are the best way to find a health problem early and help you avoid having a fall. Early diagnosis and treatment give you the best chance for managing medical conditions that are more common for people who are older than age 16.  Falls are a major cause of broken bones and head injuries in people who are older than age 8. Take precautions to prevent a fall at home.  Work with your health care provider to learn what changes you can make to improve your health and wellness and to prevent falls. This information is not intended to replace advice given to you by your health care provider. Make sure you discuss any questions you have with your health care provider. Document Revised: 11/09/2018 Document Reviewed: 06/01/2017 Elsevier Patient Education  2020 [employer].

## 2020-01-29 NOTE — Progress Notes (Signed)
   Mary Hill 05/15/1901 [id number]   History:  69 y.o. [id number] presents for breast and pelvic exam without GYN complaints. Postmenopausal-no HRT, no bleeding. Abnormal pap greater than 25 years ago, subsequent paps normal. History of endometrial polyp many years ago.   Gynecologic History No LMP recorded. Patient is postmenopausal.   Contraception: post menopausal status Last Pap: 01/16/2016. Results were:  Last mammogram: 03/02/2019. Results were: benign calcification left breast Last colonoscopy: 01/31/2008. Results were: normal Last Dexa: 02/27/2015. Results were: normal, 5 year repeat recommended  Past medical history, past surgical history, family history and social history were all reviewed and documented in the EPIC chart.  ROS:  A ROS was performed and pertinent positives and negatives are included.  Exam:  Vitals:   01/29/20 0935  BP: 130/70  Weight: 168 lb (76.2 kg)  Height: 5\' 2"  (1.575 m)   Body mass index is 30.73 kg/m.  General appearance:  Normal Thyroid:  Symmetrical, normal in size, without palpable masses or nodularity. Respiratory  Auscultation:  Clear without wheezing or rhonchi Cardiovascular  Auscultation:  Regular rate, without rubs, murmurs or gallops  Edema/varicosities:  Not grossly evident Abdominal  Soft,nontender, without masses, guarding or rebound.  Liver/spleen:  No organomegaly noted  Hernia:  None appreciated  Skin  Inspection:  Grossly normal   Breasts: Examined lying and sitting.   Right: Without masses, retractions, discharge or axillary adenopathy.   Left: Without masses, retractions, discharge or axillary adenopathy. Gentitourinary   Inguinal/mons:  Normal without inguinal adenopathy  External genitalia:  Normal  BUS/Urethra/Skene's glands:  Normal  Vagina:  Normal  Cervix:  Normal  Uterus:  Anteverted, normal in size, shape and contour.  Midline and mobile  Adnexa/parametria:     Rt: Without masses or  tenderness.   Lt: Without masses or tenderness.  Anus and perineum: Normal  Digital rectal exam: Normal sphincter tone without palpated masses or tenderness  Assessment/Plan:  69 y.o. 78 for breast and pelvic exam without GYN complaints.    Education provided on SBEs, importance of preventative screenings, current guidelines, high calcium diet, regular exercise, and multivitamin daily. Labs are done elsewhere. We discussed stopping cervical cancer screenings per guidelines and normal pap history. She would like to do one more next year when hers is due and then stop.   Screening for osteroporosis - due for bone density next month. Recommended scheduling this soon  Screening for colon cancer - overdue. Discussed current guidelines and recommendations from her colonoscopy in 2009 to follow up in 10 years  Follow up in 1 year for annual     [facility], 9:49 AM 01/29/2020

## 2020-03-04 ENCOUNTER — Ambulatory Visit
Admission: RE | Admit: 2020-03-04 | Discharge: 2020-03-04 | Disposition: A | Discharge status: Home / Self Care | Payer: Medicare HMO | Source: Ambulatory Visit | Attending: Nurse Practitioner | Admitting: Nurse Practitioner

## 2020-03-04 ENCOUNTER — Other Ambulatory Visit: Payer: Self-pay | Admitting: Nurse Practitioner

## 2020-03-04 ENCOUNTER — Other Ambulatory Visit: Payer: Self-pay

## 2020-03-04 DIAGNOSIS — R921 Mammographic calcification found on diagnostic imaging of breast: Secondary | ICD-10-CM

## 2020-03-04 DIAGNOSIS — N6012 Diffuse cystic mastopathy of left breast: Secondary | ICD-10-CM | POA: Diagnosis not present

## 2020-03-04 DIAGNOSIS — R928 Other abnormal and inconclusive findings on diagnostic imaging of breast: Secondary | ICD-10-CM

## 2020-03-10 ENCOUNTER — Ambulatory Visit
Admission: RE | Admit: 2020-03-10 | Discharge: 2020-03-10 | Disposition: A | Discharge status: Home / Self Care | Payer: Medicare HMO | Source: Ambulatory Visit | Attending: Nurse Practitioner | Admitting: Nurse Practitioner

## 2020-03-10 ENCOUNTER — Other Ambulatory Visit: Payer: Self-pay

## 2020-03-10 DIAGNOSIS — R921 Mammographic calcification found on diagnostic imaging of breast: Secondary | ICD-10-CM

## 2020-03-10 DIAGNOSIS — R928 Other abnormal and inconclusive findings on diagnostic imaging of breast: Secondary | ICD-10-CM

## 2020-03-21 DIAGNOSIS — Z1159 Encounter for screening for other viral diseases: Secondary | ICD-10-CM | POA: Diagnosis not present

## 2020-03-21 DIAGNOSIS — Z23 Encounter for immunization: Secondary | ICD-10-CM | POA: Diagnosis not present

## 2020-03-21 DIAGNOSIS — Z131 Encounter for screening for diabetes mellitus: Secondary | ICD-10-CM | POA: Diagnosis not present

## 2020-03-21 DIAGNOSIS — Z0000 Encounter for general adult medical examination without abnormal findings: Secondary | ICD-10-CM | POA: Diagnosis not present

## 2020-03-21 DIAGNOSIS — Z1211 Encounter for screening for malignant neoplasm of colon: Secondary | ICD-10-CM | POA: Diagnosis not present

## 2020-03-21 DIAGNOSIS — Z136 Encounter for screening for cardiovascular disorders: Secondary | ICD-10-CM | POA: Diagnosis not present

## 2020-05-17 ENCOUNTER — Ambulatory Visit: Payer: Medicare HMO | Attending: Internal Medicine

## 2020-05-17 DIAGNOSIS — Z23 Encounter for immunization: Secondary | ICD-10-CM

## 2020-05-17 NOTE — Progress Notes (Signed)
   Covid-19 Vaccination Clinic  Name:  Mary Hill    MRN: [id number] DOB: 05/10/1951  05/17/2020  Ms. Mary Hill was observed post Covid-19 immunization for 15 minutes without incident. She was provided with Vaccine Information Sheet and instruction to access the V-Safe system.   Ms. Mary Hill was instructed to call 911 with any severe reactions post vaccine: Marland Kitchen Difficulty breathing  . Swelling of face and throat  . A fast heartbeat  . A bad rash all over body  . Dizziness and weakness

## 2021-01-29 ENCOUNTER — Encounter: Payer: Medicare HMO | Admitting: Nurse Practitioner

## 2021-02-03 ENCOUNTER — Other Ambulatory Visit: Payer: Self-pay | Admitting: Nurse Practitioner

## 2021-02-03 DIAGNOSIS — Z1231 Encounter for screening mammogram for malignant neoplasm of breast: Secondary | ICD-10-CM

## 2021-02-05 ENCOUNTER — Encounter: Payer: Self-pay | Admitting: Nurse Practitioner

## 2021-02-05 ENCOUNTER — Other Ambulatory Visit: Payer: Self-pay

## 2021-02-05 ENCOUNTER — Ambulatory Visit (INDEPENDENT_AMBULATORY_CARE_PROVIDER_SITE_OTHER): Payer: Medicare HMO | Admitting: Nurse Practitioner

## 2021-02-05 VITALS — BP 130/74 | Ht 62.5 in | Wt 164.0 lb

## 2021-02-05 DIAGNOSIS — Z01419 Encounter for gynecological examination (general) (routine) without abnormal findings: Secondary | ICD-10-CM | POA: Diagnosis not present

## 2021-02-05 DIAGNOSIS — Z780 Asymptomatic menopausal state: Secondary | ICD-10-CM

## 2021-02-05 NOTE — Progress Notes (Signed)
   Mary Hill 1951-02-14 [id number]   History:  70 y.o. [id number] presents for breast and pelvic exam without GYN complaints. Postmenopausal - no HRT, no bleeding. Abnormal pap greater than 25 years ago, subsequent paps normal. History of benign endometrial polyp many years ago.   Gynecologic History No LMP recorded. Patient is postmenopausal.   Contraception: post menopausal status Last Pap: 01/16/2016. Results were: Normal Last mammogram: 03/04/2020. Results were: stable benign calcification left breast Last colonoscopy: 01/31/2008. Results were: normal Last Dexa: 02/27/2015. Results were: normal, 5 year repeat recommended  Past medical history, past surgical history, family history and social history were all reviewed and documented in the EPIC chart. Married. 2 children - one in [place], one in [place]  ROS:  A ROS was performed and pertinent positives and negatives are included.  Exam:  Vitals:   02/05/21 0928  BP: 130/74  Weight: 164 lb (74.4 kg)  Height: 5' 2.5" (1.588 m)    Body mass index is 29.52 kg/m.  General appearance:  Normal Thyroid:  Symmetrical, normal in size, without palpable masses or nodularity. Respiratory  Auscultation:  Clear without wheezing or rhonchi Cardiovascular  Auscultation:  Regular rate, without rubs, murmurs or gallops  Edema/varicosities:  Not grossly evident Abdominal  Soft,nontender, without masses, guarding or rebound.  Liver/spleen:  No organomegaly noted  Hernia:  None appreciated  Skin  Inspection:  Grossly normal   Breasts: Examined lying and sitting.   Right: Without masses, retractions, discharge or axillary adenopathy.   Left: Without masses, retractions, discharge or axillary adenopathy. Gentitourinary   Inguinal/mons:  Normal without inguinal adenopathy  External genitalia:  Normal  BUS/Urethra/Skene's glands:  Normal  Vagina:  Normal. Mild atrophic changes  Cervix:  Normal  Uterus:  Anteverted, normal in size,  shape and contour.  Midline and mobile  Adnexa/parametria:     Rt: Without masses or tenderness.   Lt: Without masses or tenderness.  Anus and perineum: Normal  Digital rectal exam: Normal sphincter tone without palpated masses or tenderness  Assessment/Plan:  70 y.o. [id number] for breast and pelvic exam .  Well female exam with routine gynecological exam - Education provided on SBEs, importance of preventative screenings, current guidelines, high calcium diet, regular exercise, and multivitamin daily. Labs with PCP.   Postmenopausal - Plan: DG Bone Density. No HRT, no bleeding.   Screening for cervical cancer - Abnormal pap > 25 years ago that did not require intervention per patient. Discussed current guidelines and the option to stop screening and she is agreeable.   Screening for breast cancer - Normal mammogram history.  Continue annual screenings.  Normal breast exam today. Mammogram scheduled 8/25.  Screening for colon cancer - 2009 colonoscopy. Overdue. Discussed current guidelines and the importance of preventative screenings. She plans to schedule this soon.  Follow up in 1 year for annual     Olivia Mackie [facility], 9:42 AM 02/05/2021

## 2021-02-24 ENCOUNTER — Ambulatory Visit (INDEPENDENT_AMBULATORY_CARE_PROVIDER_SITE_OTHER): Payer: Medicare HMO

## 2021-02-24 ENCOUNTER — Other Ambulatory Visit: Payer: Self-pay | Admitting: Nurse Practitioner

## 2021-02-24 ENCOUNTER — Other Ambulatory Visit: Payer: Self-pay

## 2021-02-24 DIAGNOSIS — Z13820 Encounter for screening for osteoporosis: Secondary | ICD-10-CM

## 2021-02-24 DIAGNOSIS — Z780 Asymptomatic menopausal state: Secondary | ICD-10-CM

## 2021-03-24 DIAGNOSIS — Z0000 Encounter for general adult medical examination without abnormal findings: Secondary | ICD-10-CM | POA: Diagnosis not present

## 2021-03-24 DIAGNOSIS — Z1211 Encounter for screening for malignant neoplasm of colon: Secondary | ICD-10-CM | POA: Diagnosis not present

## 2021-03-24 DIAGNOSIS — Z131 Encounter for screening for diabetes mellitus: Secondary | ICD-10-CM | POA: Diagnosis not present

## 2021-03-26 ENCOUNTER — Ambulatory Visit
Admission: RE | Admit: 2021-03-26 | Discharge: 2021-03-26 | Disposition: A | Discharge status: Home / Self Care | Payer: Medicare HMO | Source: Ambulatory Visit | Attending: Nurse Practitioner | Admitting: Nurse Practitioner

## 2021-03-26 ENCOUNTER — Other Ambulatory Visit: Payer: Self-pay

## 2021-03-26 DIAGNOSIS — Z1231 Encounter for screening mammogram for malignant neoplasm of breast: Secondary | ICD-10-CM | POA: Diagnosis not present

## 2021-08-04 DIAGNOSIS — H524 Presbyopia: Secondary | ICD-10-CM | POA: Diagnosis not present

## 2021-09-23 DIAGNOSIS — M79661 Pain in right lower leg: Secondary | ICD-10-CM | POA: Diagnosis not present

## 2021-09-23 DIAGNOSIS — R030 Elevated blood-pressure reading, without diagnosis of hypertension: Secondary | ICD-10-CM | POA: Diagnosis not present

## 2021-09-23 DIAGNOSIS — M79662 Pain in left lower leg: Secondary | ICD-10-CM | POA: Diagnosis not present

## 2021-11-03 ENCOUNTER — Other Ambulatory Visit: Payer: Self-pay | Admitting: Sports Medicine

## 2021-11-03 DIAGNOSIS — M25551 Pain in right hip: Secondary | ICD-10-CM | POA: Diagnosis not present

## 2021-11-03 DIAGNOSIS — M545 Low back pain, unspecified: Secondary | ICD-10-CM | POA: Diagnosis not present

## 2021-11-03 DIAGNOSIS — M5126 Other intervertebral disc displacement, lumbar region: Secondary | ICD-10-CM

## 2021-11-03 DIAGNOSIS — M25552 Pain in left hip: Secondary | ICD-10-CM | POA: Diagnosis not present

## 2021-11-07 ENCOUNTER — Other Ambulatory Visit: Payer: Medicare HMO

## 2021-11-10 ENCOUNTER — Ambulatory Visit
Admission: RE | Admit: 2021-11-10 | Discharge: 2021-11-10 | Disposition: A | Discharge status: Home / Self Care | Payer: Medicare HMO | Source: Ambulatory Visit | Attending: Sports Medicine | Admitting: Sports Medicine

## 2021-11-10 DIAGNOSIS — M48061 Spinal stenosis, lumbar region without neurogenic claudication: Secondary | ICD-10-CM | POA: Diagnosis not present

## 2021-11-10 DIAGNOSIS — M5126 Other intervertebral disc displacement, lumbar region: Secondary | ICD-10-CM

## 2021-11-10 DIAGNOSIS — M545 Low back pain, unspecified: Secondary | ICD-10-CM | POA: Diagnosis not present

## 2021-11-11 ENCOUNTER — Other Ambulatory Visit: Payer: Medicare HMO

## 2021-12-22 DIAGNOSIS — K635 Polyp of colon: Secondary | ICD-10-CM | POA: Diagnosis not present

## 2021-12-22 DIAGNOSIS — K648 Other hemorrhoids: Secondary | ICD-10-CM | POA: Diagnosis not present

## 2021-12-22 DIAGNOSIS — Z1211 Encounter for screening for malignant neoplasm of colon: Secondary | ICD-10-CM | POA: Diagnosis not present

## 2021-12-24 DIAGNOSIS — K635 Polyp of colon: Secondary | ICD-10-CM | POA: Diagnosis not present

## 2022-02-08 ENCOUNTER — Encounter: Payer: Self-pay | Admitting: Nurse Practitioner

## 2022-02-08 ENCOUNTER — Ambulatory Visit (INDEPENDENT_AMBULATORY_CARE_PROVIDER_SITE_OTHER): Payer: Medicare HMO | Admitting: Nurse Practitioner

## 2022-02-08 VITALS — BP 134/80 | Ht 62.0 in | Wt 164.0 lb

## 2022-02-08 DIAGNOSIS — Z780 Asymptomatic menopausal state: Secondary | ICD-10-CM

## 2022-02-08 DIAGNOSIS — Z01419 Encounter for gynecological examination (general) (routine) without abnormal findings: Secondary | ICD-10-CM

## 2022-02-08 NOTE — Progress Notes (Signed)
   Mary Hill 23-Jul-1951 [id number]   History:  71 y.o. [id number] presents for breast and pelvic exam without GYN complaints. Postmenopausal - no HRT, no bleeding. Abnormal pap greater than 25 years ago, subsequent paps normal.   Gynecologic History No LMP recorded. Patient is postmenopausal.   Contraception: post menopausal status Sexually active: No  Health maintenance Last Pap: 01/16/2016. Results were: Normal Last mammogram: 03/26/2021. Results were: Normal Last colonoscopy: 12/22/2021. Results were: Normal, no follow up recommended Last Dexa: 02/25/2020. Results were: Normal, 5-year repeat recommended  Past medical history, past surgical history, family history and social history were all reviewed and documented in the EPIC chart. Married. 2 children - one in [place], one in [place].  ROS:  A ROS was performed and pertinent positives and negatives are included.  Exam:  Vitals:   02/08/22 0944  BP: 134/80  Weight: 164 lb (74.4 kg)  Height: 5\' 2"  (1.575 m)     Body mass index is 30 kg/m.  General appearance:  Normal Thyroid:  Symmetrical, normal in size, without palpable masses or nodularity. Respiratory  Auscultation:  Clear without wheezing or rhonchi Cardiovascular  Auscultation:  Regular rate, without rubs, murmurs or gallops  Edema/varicosities:  Not grossly evident Abdominal  Soft,nontender, without masses, guarding or rebound.  Liver/spleen:  No organomegaly noted  Hernia:  None appreciated  Skin  Inspection:  Grossly normal   Breasts: Examined lying and sitting.   Right: Without masses, retractions, discharge or axillary adenopathy.   Left: Without masses, retractions, discharge or axillary adenopathy. Genitourinary   Inguinal/mons:  Normal without inguinal adenopathy  External genitalia:  Normal appearing vulva with no masses, tenderness, or lesions  BUS/Urethra/Skene's glands:  Normal  Vagina:  Normal appearing with normal color and  discharge, no lesions. Mild atrophic changes  Cervix:  Normal appearing without discharge or lesions  Uterus:  Normal in size, shape and contour.  Midline and mobile, nontender  Adnexa/parametria:     Rt: Normal in size, without masses or tenderness.   Lt: Normal in size, without masses or tenderness.  Anus and perineum: Normal  Digital rectal exam: Not indicated (recent colonoscopy)  Patient informed chaperone available to be present for breast and pelvic exam. Patient has requested no chaperone to be present. Patient has been advised what will be completed during breast and pelvic exam.   Assessment/Plan:  71 y.o. 62 for breast and pelvic exam .  Well female exam with routine gynecological exam - Education provided on SBEs, importance of preventative screenings, current guidelines, high calcium diet, regular exercise, and multivitamin daily. Labs with PCP.   Postmenopausal - No HRT, no bleeding.   Screening for cervical cancer - Abnormal pap > 25 years ago that did not require intervention per patient. No longer screening per guidelines.    Screening for breast cancer - Normal mammogram history.  Continue annual screenings.  Normal breast exam today.   Screening for colon cancer - 11/2021 colonoscopy. Screenings no longer recommended per GI due to age.   Screening for osteoporosis - Normal bone density 01/2020. Will repeat at 5-year interval per recommendation. Continue Vitamin D + Calcium. Walks and does senior classes a few days per week.   Follow up in 2 years for breast and pelvic exam.      03/2020 [facility], 10:02 AM 02/08/2022

## 2022-02-11 ENCOUNTER — Other Ambulatory Visit: Payer: Self-pay | Admitting: Nurse Practitioner

## 2022-02-11 ENCOUNTER — Other Ambulatory Visit: Payer: Self-pay | Admitting: Internal Medicine

## 2022-02-11 DIAGNOSIS — Z1231 Encounter for screening mammogram for malignant neoplasm of breast: Secondary | ICD-10-CM

## 2022-03-29 ENCOUNTER — Ambulatory Visit
Admission: RE | Admit: 2022-03-29 | Discharge: 2022-03-29 | Disposition: A | Discharge status: Home / Self Care | Payer: Medicare HMO | Source: Ambulatory Visit | Attending: Internal Medicine | Admitting: Internal Medicine

## 2022-03-29 DIAGNOSIS — Z1231 Encounter for screening mammogram for malignant neoplasm of breast: Secondary | ICD-10-CM | POA: Diagnosis not present

## 2022-03-31 ENCOUNTER — Other Ambulatory Visit: Payer: Self-pay | Admitting: Internal Medicine

## 2022-03-31 DIAGNOSIS — R928 Other abnormal and inconclusive findings on diagnostic imaging of breast: Secondary | ICD-10-CM

## 2022-04-08 DIAGNOSIS — J302 Other seasonal allergic rhinitis: Secondary | ICD-10-CM | POA: Diagnosis not present

## 2022-04-08 DIAGNOSIS — M5416 Radiculopathy, lumbar region: Secondary | ICD-10-CM | POA: Diagnosis not present

## 2022-04-08 DIAGNOSIS — Z780 Asymptomatic menopausal state: Secondary | ICD-10-CM | POA: Diagnosis not present

## 2022-04-08 DIAGNOSIS — Z136 Encounter for screening for cardiovascular disorders: Secondary | ICD-10-CM | POA: Diagnosis not present

## 2022-04-08 DIAGNOSIS — Z0000 Encounter for general adult medical examination without abnormal findings: Secondary | ICD-10-CM | POA: Diagnosis not present

## 2022-04-08 DIAGNOSIS — H6122 Impacted cerumen, left ear: Secondary | ICD-10-CM | POA: Diagnosis not present

## 2022-04-08 DIAGNOSIS — M5136 Other intervertebral disc degeneration, lumbar region: Secondary | ICD-10-CM | POA: Diagnosis not present

## 2022-04-08 DIAGNOSIS — Z23 Encounter for immunization: Secondary | ICD-10-CM | POA: Diagnosis not present

## 2022-04-16 ENCOUNTER — Ambulatory Visit
Admission: RE | Admit: 2022-04-16 | Discharge: 2022-04-16 | Disposition: A | Discharge status: Home / Self Care | Payer: Medicare HMO | Source: Ambulatory Visit | Attending: Internal Medicine | Admitting: Internal Medicine

## 2022-04-16 DIAGNOSIS — R928 Other abnormal and inconclusive findings on diagnostic imaging of breast: Secondary | ICD-10-CM | POA: Diagnosis not present

## 2022-04-16 DIAGNOSIS — N6323 Unspecified lump in the left breast, lower outer quadrant: Secondary | ICD-10-CM | POA: Diagnosis not present

## 2022-04-16 DIAGNOSIS — N6001 Solitary cyst of right breast: Secondary | ICD-10-CM | POA: Diagnosis not present

## 2022-05-19 ENCOUNTER — Other Ambulatory Visit: Payer: Self-pay | Admitting: Internal Medicine

## 2022-05-19 DIAGNOSIS — N6320 Unspecified lump in the left breast, unspecified quadrant: Secondary | ICD-10-CM

## 2022-05-19 DIAGNOSIS — N6310 Unspecified lump in the right breast, unspecified quadrant: Secondary | ICD-10-CM

## 2022-08-05 DIAGNOSIS — H524 Presbyopia: Secondary | ICD-10-CM | POA: Diagnosis not present

## 2022-08-05 DIAGNOSIS — Z961 Presence of intraocular lens: Secondary | ICD-10-CM | POA: Diagnosis not present

## 2022-10-18 ENCOUNTER — Ambulatory Visit
Admission: RE | Admit: 2022-10-18 | Discharge: 2022-10-18 | Disposition: A | Discharge status: Home / Self Care | Payer: Medicare HMO | Source: Ambulatory Visit | Attending: Internal Medicine | Admitting: Internal Medicine

## 2022-10-18 DIAGNOSIS — N6323 Unspecified lump in the left breast, lower outer quadrant: Secondary | ICD-10-CM | POA: Diagnosis not present

## 2022-10-18 DIAGNOSIS — N6320 Unspecified lump in the left breast, unspecified quadrant: Secondary | ICD-10-CM

## 2022-10-18 DIAGNOSIS — N6315 Unspecified lump in the right breast, overlapping quadrants: Secondary | ICD-10-CM | POA: Diagnosis not present

## 2022-10-18 DIAGNOSIS — N6310 Unspecified lump in the right breast, unspecified quadrant: Secondary | ICD-10-CM

## 2022-10-21 ENCOUNTER — Other Ambulatory Visit: Payer: Self-pay | Admitting: Internal Medicine

## 2022-10-21 DIAGNOSIS — N6310 Unspecified lump in the right breast, unspecified quadrant: Secondary | ICD-10-CM

## 2023-01-05 DIAGNOSIS — M5441 Lumbago with sciatica, right side: Secondary | ICD-10-CM | POA: Diagnosis not present

## 2023-03-31 ENCOUNTER — Ambulatory Visit
Admission: RE | Admit: 2023-03-31 | Discharge: 2023-03-31 | Disposition: A | Discharge status: Home / Self Care | Payer: Medicare HMO | Source: Ambulatory Visit | Attending: Internal Medicine | Admitting: Internal Medicine

## 2023-03-31 DIAGNOSIS — N6001 Solitary cyst of right breast: Secondary | ICD-10-CM | POA: Diagnosis not present

## 2023-03-31 DIAGNOSIS — N6315 Unspecified lump in the right breast, overlapping quadrants: Secondary | ICD-10-CM | POA: Diagnosis not present

## 2023-03-31 DIAGNOSIS — N6310 Unspecified lump in the right breast, unspecified quadrant: Secondary | ICD-10-CM | POA: Diagnosis not present

## 2023-04-12 DIAGNOSIS — J302 Other seasonal allergic rhinitis: Secondary | ICD-10-CM | POA: Diagnosis not present

## 2023-04-12 DIAGNOSIS — Z0000 Encounter for general adult medical examination without abnormal findings: Secondary | ICD-10-CM | POA: Diagnosis not present

## 2023-04-12 DIAGNOSIS — Z780 Asymptomatic menopausal state: Secondary | ICD-10-CM | POA: Diagnosis not present

## 2023-04-12 DIAGNOSIS — Z13220 Encounter for screening for lipoid disorders: Secondary | ICD-10-CM | POA: Diagnosis not present

## 2023-04-12 DIAGNOSIS — Z23 Encounter for immunization: Secondary | ICD-10-CM | POA: Diagnosis not present

## 2023-04-12 DIAGNOSIS — Z136 Encounter for screening for cardiovascular disorders: Secondary | ICD-10-CM | POA: Diagnosis not present

## 2023-04-12 DIAGNOSIS — M5136 Other intervertebral disc degeneration, lumbar region: Secondary | ICD-10-CM | POA: Diagnosis not present

## 2023-04-12 DIAGNOSIS — M5416 Radiculopathy, lumbar region: Secondary | ICD-10-CM | POA: Diagnosis not present

## 2023-12-05 DIAGNOSIS — M5441 Lumbago with sciatica, right side: Secondary | ICD-10-CM | POA: Diagnosis not present

## 2024-02-13 DIAGNOSIS — M5441 Lumbago with sciatica, right side: Secondary | ICD-10-CM | POA: Diagnosis not present

## 2024-02-15 ENCOUNTER — Encounter: Payer: Self-pay | Admitting: Nurse Practitioner

## 2024-02-15 ENCOUNTER — Ambulatory Visit (INDEPENDENT_AMBULATORY_CARE_PROVIDER_SITE_OTHER): Admitting: Nurse Practitioner

## 2024-02-15 VITALS — BP 112/60 | HR 82 | Ht 63.0 in | Wt 162.6 lb

## 2024-02-15 DIAGNOSIS — Z01419 Encounter for gynecological examination (general) (routine) without abnormal findings: Secondary | ICD-10-CM | POA: Diagnosis not present

## 2024-02-15 DIAGNOSIS — Z780 Asymptomatic menopausal state: Secondary | ICD-10-CM

## 2024-02-15 NOTE — Patient Instructions (Signed)

## 2024-02-15 NOTE — Progress Notes (Signed)
 Mary Hill March 12, 1951 [id number]   History:  73 y.o. [id number] presents for breast and pelvic exam without GYN complaints. Postmenopausal - no HRT, no bleeding. Abnormal pap greater than 25 years ago, subsequent paps normal.   Gynecologic History No LMP recorded. Patient is postmenopausal.   Contraception: post menopausal status Sexually active: No  Health maintenance Last Pap: 01/16/2016. Results were: Normal Last mammogram: 03/30/2024 (diagnostic). Results were: stable benign right breast mass Last colonoscopy: 12/22/2021. Results were: Normal, no follow up recommended Last Dexa: 02/25/2020. Results were: Normal, 5-year repeat recommended  Past medical history, past surgical history, family history and social history were all reviewed and documented in the EPIC chart. Married. 2 children - one in [place], one in [place].  ROS:  A ROS was performed and pertinent positives and negatives are included.  Exam:  Vitals:   02/15/24 1131  BP: 112/60  Pulse: 82  SpO2: 99%  Weight: 162 lb 9.6 oz (73.8 kg)  Height: 5' 3 (1.6 m)      Body mass index is 28.8 kg/m.  General appearance:  Normal Thyroid:  Symmetrical, normal in size, without palpable masses or nodularity. Respiratory  Auscultation:  Clear without wheezing or rhonchi Cardiovascular  Auscultation:  Regular rate, without rubs, murmurs or gallops  Edema/varicosities:  Not grossly evident Abdominal  Soft,nontender, without masses, guarding or rebound.  Liver/spleen:  No organomegaly noted  Hernia:  None appreciated  Skin  Inspection:  Grossly normal   Breasts: Examined lying and sitting.   Right: Without masses, retractions, discharge or axillary adenopathy.   Left: Without masses, retractions, discharge or axillary adenopathy. Pelvic: External genitalia:  no lesions              Urethra:  normal appearing urethra with no masses, tenderness or lesions              Bartholins and Skenes: normal                  Vagina: normal appearing vagina with normal color and discharge, no lesions              Cervix: no lesions Bimanual Exam:  Uterus:  no masses or tenderness              Adnexa: no mass, fullness, tenderness              Rectovaginal: Deferred              Anus:  normal, no lesions  Mary Mole, NP student performed exam with observation.   Assessment/Plan:  73 y.o. [id number] for breast and pelvic exam .  Encounter for breast and pelvic examination - Education provided on SBEs, importance of preventative screenings, current guidelines, high calcium diet, regular exercise, and multivitamin daily. Labs with PCP.   Postmenopausal - No HRT, no bleeding.   Screening for cervical cancer - Abnormal pap > 25 years ago that did not require intervention per patient. No longer screening per guidelines.    Screening for breast cancer - Stable benign right breast mass. Continue annual screenings.  Normal breast exam today.   Screening for colon cancer - 11/2021 colonoscopy. Screenings no longer recommended per GI due to age.   Screening for osteoporosis - Normal bone density 01/2020. Will repeat at 5-year interval per recommendation. Continue Vitamin D  + Calcium. Walks and does senior classes a few days per week.   Return in about 2 years (around 02/14/2026) for B&P.     Mary Hill  [facility], 11:39 AM 02/15/2024

## 2024-03-08 ENCOUNTER — Other Ambulatory Visit: Payer: Self-pay | Admitting: Internal Medicine

## 2024-03-08 DIAGNOSIS — N6310 Unspecified lump in the right breast, unspecified quadrant: Secondary | ICD-10-CM

## 2024-03-22 IMAGING — MR MR LUMBAR SPINE W/O CM
4 of 5 series · 25 of 48 positions shown · non-contrast
Comparison: Lumbar spine radiographs 10/30/2010

CLINICAL DATA: Herniated nucleus polyposis, lumbar. Pain in the low
back and legs for 2 months.

EXAM:
MRI LUMBAR SPINE WITHOUT CONTRAST
TECHNIQUE: Multiplanar, multisequence MR imaging of the lumbar spine was
performed. No intravenous contrast was administered.

[Series 3: T2 · sagittal · 4.0mm · 0.53mm/px · 6 of 15 slices shown (1 of 2)]
[im 1/15]
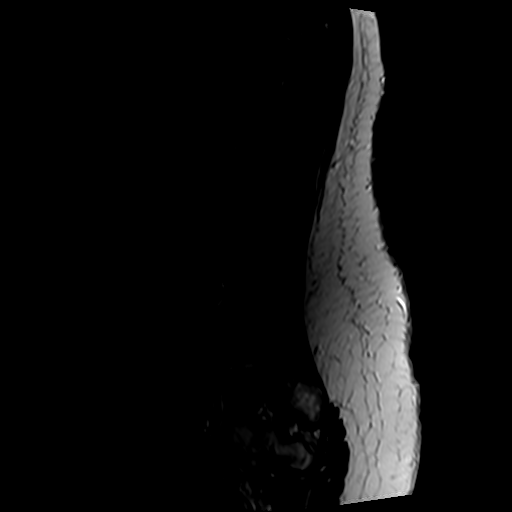
[im 3/15]
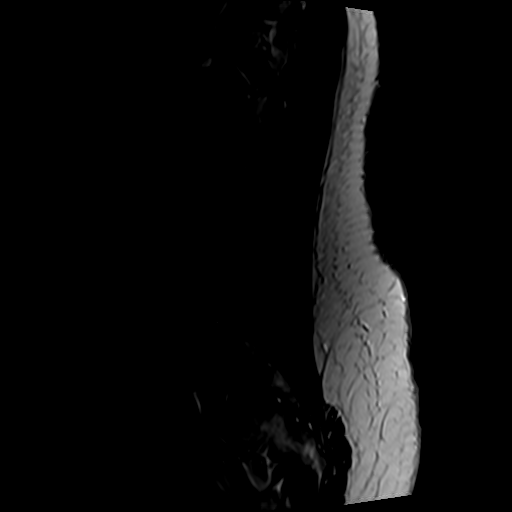
[im 6/15]
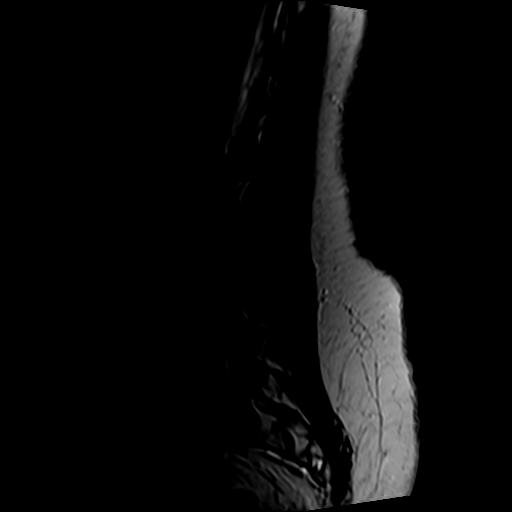
[im 9/15]
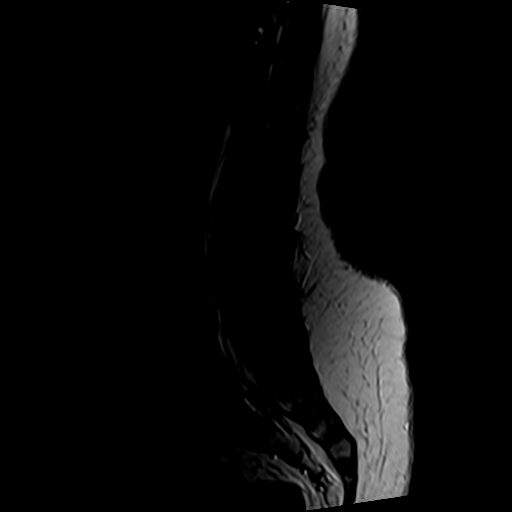
[im 12/15]
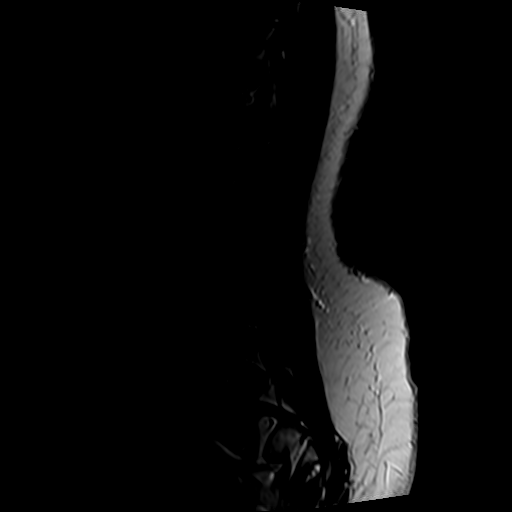
[im 15/15]
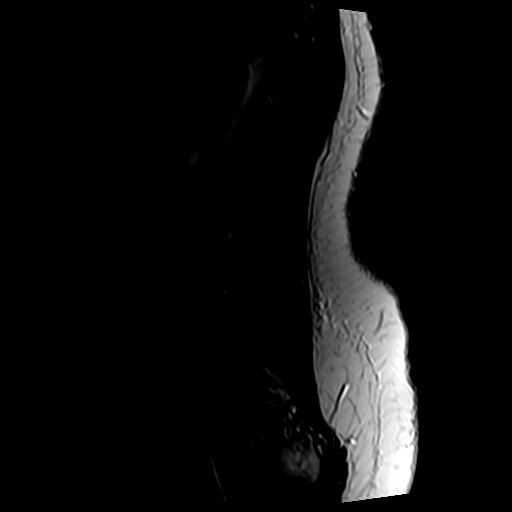

[Series 5: T1 · sagittal · 4.0mm · 0.53mm/px · 6 of 15 slices shown (1 of 2)]
[im 1/15]
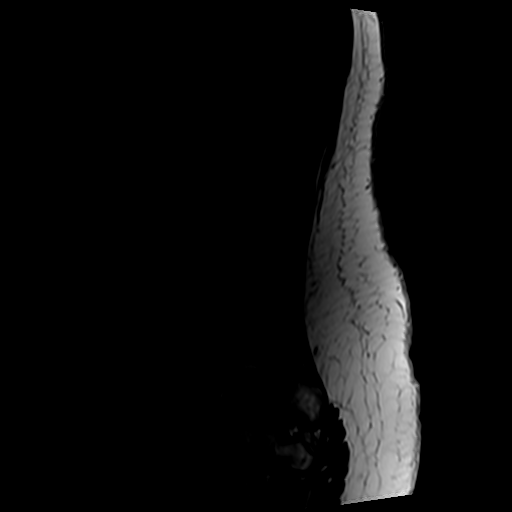
[im 3/15]
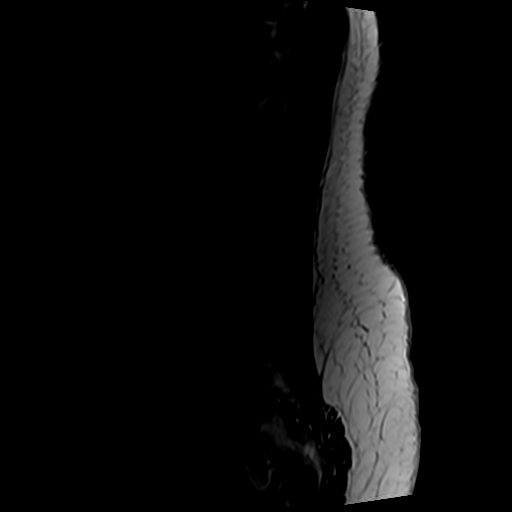
[im 6/15]
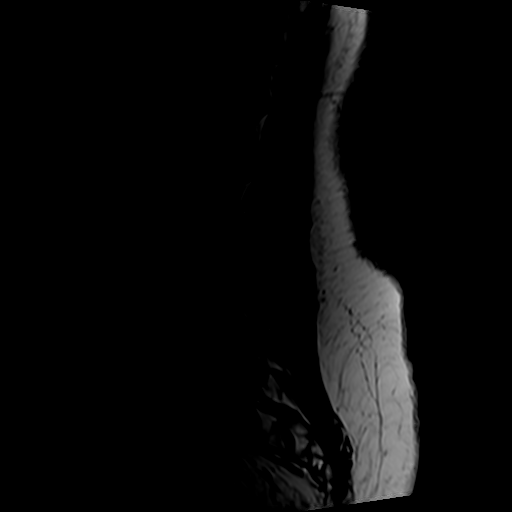
[im 9/15]
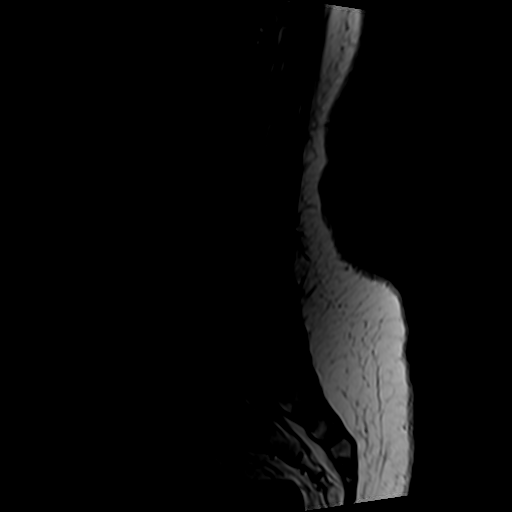
[im 12/15]
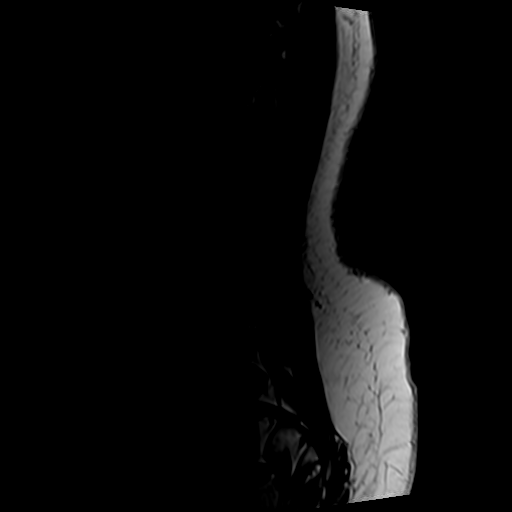
[im 15/15]
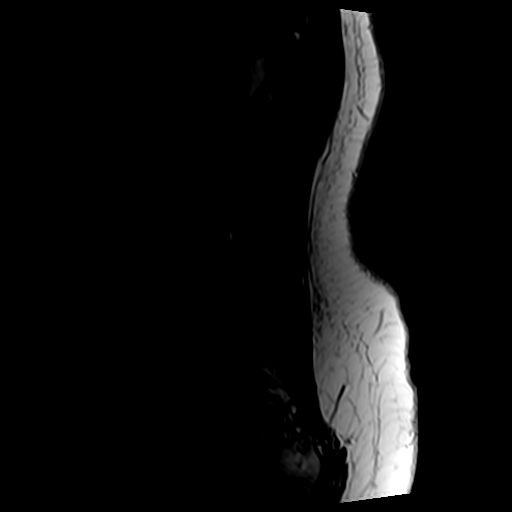

[Series 6: T2 · axial · 4.0mm · 0.70mm/px · z∈[-124,+91]mm · 9 of 38 slices shown (2 of 2)]
[im 1/38]
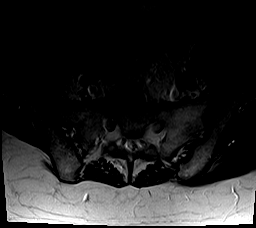
[im 6/38]
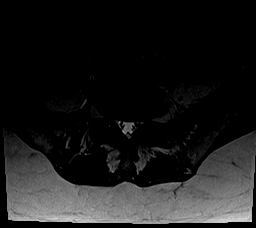
[im 11/38]
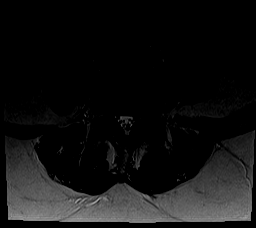
[im 16/38]
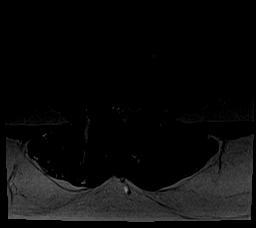
[im 19/38]
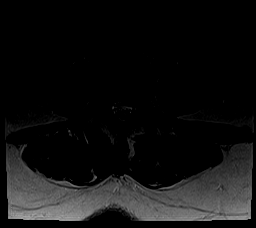
[im 22/38]
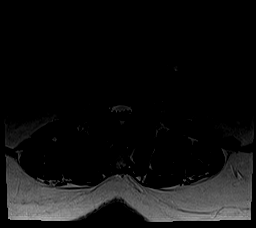
[im 27/38]
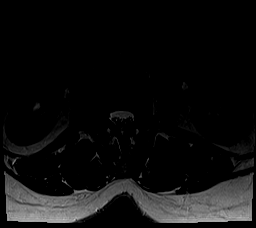
[im 32/38]
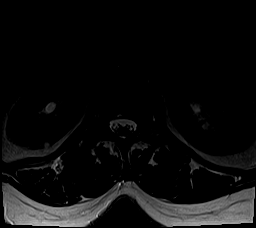
[im 38/38]
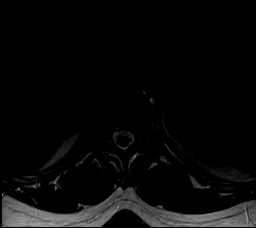

[Series 7: T1 · axial · 4.0mm · 0.35mm/px · z∈[-124,+59]mm · 4 of 38 slices shown (2 of 2)]
[im 1/38]
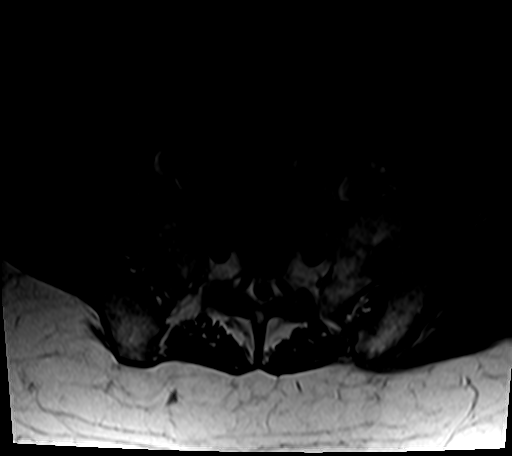
[im 6/38]
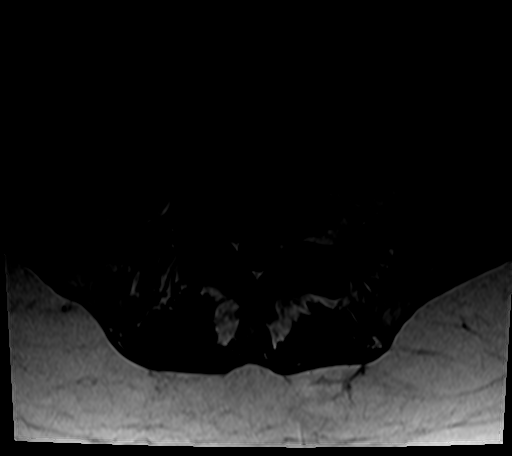
[im 19/38]
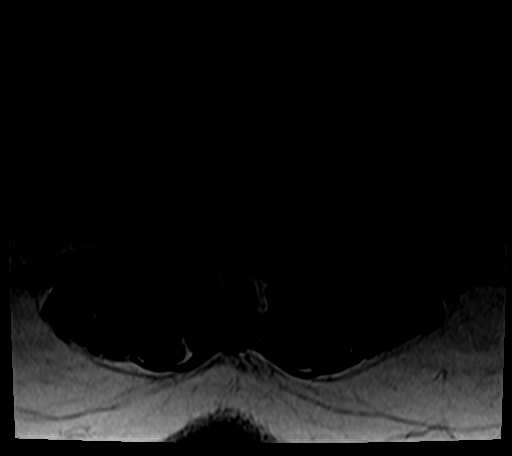
[im 32/38]
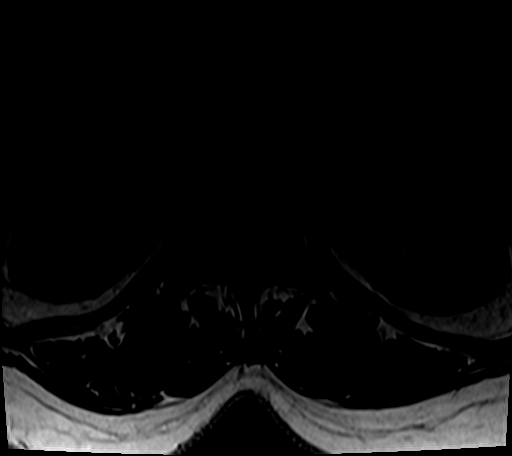

[25 of 48 positions shown; findings below may reference images not displayed]

FINDINGS: Segmentation:  Standard.

Alignment:  Normal.

Vertebrae: No fracture, suspicious marrow lesion, or significant
marrow edema.

Conus medullaris and cauda equina: Conus extends to the L2 level.
Conus and cauda equina appear normal.

Paraspinal and other soft tissues: Subcentimeter right renal cyst
for which no follow-up imaging is recommended.

Disc levels:

Disc desiccation greatest at L3-4. Preserved disc space heights.
Diffuse congenital narrowing of the lumbar spinal canal due to short
pedicles.

T12-L1: Mild facet arthrosis without disc herniation or stenosis.

L1-2: Mild disc bulging and mild-to-moderate facet hypertrophy
without stenosis.

L2-3: Mild disc bulging and mild-to-moderate facet and ligamentum
flavum hypertrophy result in mild left lateral recess stenosis
without spinal or neural foraminal stenosis.

L3-4: Disc bulging and moderate to severe facet and ligamentum
flavum hypertrophy result in mild spinal stenosis, moderate
bilateral lateral recess stenosis, and mild bilateral neural
foraminal stenosis. Potential bilateral L4 nerve root impingement.

L4-5: Mild disc bulging eccentric to the left and moderate facet and
ligamentum flavum hypertrophy result in mild bilateral lateral
recess stenosis without spinal or neural foraminal stenosis.

L5-S1: Minimal disc bulging and moderate facet and ligamentum flavum
hypertrophy without stenosis.
IMPRESSION: Multilevel lumbar disc and facet degeneration, most notable at L3-4
where there is mild spinal stenosis, moderate lateral recess
stenosis, and mild bilateral neural foraminal stenosis.

## 2024-04-03 ENCOUNTER — Ambulatory Visit
Admission: RE | Admit: 2024-04-03 | Discharge: 2024-04-03 | Disposition: A | Discharge status: Home / Self Care | Source: Ambulatory Visit | Attending: Internal Medicine | Admitting: Internal Medicine

## 2024-04-03 DIAGNOSIS — N6310 Unspecified lump in the right breast, unspecified quadrant: Secondary | ICD-10-CM

## 2024-04-03 DIAGNOSIS — R928 Other abnormal and inconclusive findings on diagnostic imaging of breast: Secondary | ICD-10-CM | POA: Diagnosis not present

## 2024-04-03 DIAGNOSIS — N6315 Unspecified lump in the right breast, overlapping quadrants: Secondary | ICD-10-CM | POA: Diagnosis not present

## 2024-04-12 DIAGNOSIS — Z13220 Encounter for screening for lipoid disorders: Secondary | ICD-10-CM | POA: Diagnosis not present

## 2024-04-12 DIAGNOSIS — M5416 Radiculopathy, lumbar region: Secondary | ICD-10-CM | POA: Diagnosis not present

## 2024-04-12 DIAGNOSIS — Z13820 Encounter for screening for osteoporosis: Secondary | ICD-10-CM | POA: Diagnosis not present

## 2024-04-12 DIAGNOSIS — Z0000 Encounter for general adult medical examination without abnormal findings: Secondary | ICD-10-CM | POA: Diagnosis not present

## 2024-04-12 DIAGNOSIS — J302 Other seasonal allergic rhinitis: Secondary | ICD-10-CM | POA: Diagnosis not present

## 2024-04-12 DIAGNOSIS — Z136 Encounter for screening for cardiovascular disorders: Secondary | ICD-10-CM | POA: Diagnosis not present

## 2024-04-12 DIAGNOSIS — Z780 Asymptomatic menopausal state: Secondary | ICD-10-CM | POA: Diagnosis not present

## 2024-04-12 DIAGNOSIS — Z23 Encounter for immunization: Secondary | ICD-10-CM | POA: Diagnosis not present

## 2024-05-11 DIAGNOSIS — E2839 Other primary ovarian failure: Secondary | ICD-10-CM | POA: Diagnosis not present
